# Patient Record
Sex: Female | Born: 1970 | Race: White | Hispanic: No | Marital: Married
Health system: Midwestern US, Community
[De-identification: ages and names within clinical notes are randomized; demographics above are authoritative.]

## PROBLEM LIST (undated history)

## (undated) DIAGNOSIS — E119 Type 2 diabetes mellitus without complications: Secondary | ICD-10-CM

## (undated) DIAGNOSIS — M791 Myalgia, unspecified site: Secondary | ICD-10-CM

## (undated) DIAGNOSIS — E7800 Pure hypercholesterolemia, unspecified: Secondary | ICD-10-CM

## (undated) DIAGNOSIS — I10 Essential (primary) hypertension: Secondary | ICD-10-CM

## (undated) HISTORY — PX: CHOLECYSTECTOMY: SHX55

---

## 2014-04-07 LAB — RUBELLA AB, IGG: Rubella IgG, QL: IMMUNE

## 2014-04-08 LAB — HEP B SURFACE AB
Hep B surface Ab Interp.: NEGATIVE — AB
Hepatitis B surface Ab: 7.22 m[IU]/mL — ABNORMAL LOW (ref >10.0)

## 2014-04-08 LAB — MUMPS AB, IGG: Mumps Abs, IgG: <9 AU/mL — ABNORMAL LOW (ref >10.9)

## 2014-04-08 LAB — VZV AB, IGG: V. zoster, IgG: 1040 index (ref >165)

## 2014-04-08 LAB — RUBEOLA AB, IGG: Rubeola Ab, IgG: 128 AU/mL (ref >29.9)

## 2014-04-08 LAB — HEPATITIS B SURFACE ANTIBODY
Hep B S Ab Interp: NEGATIVE — AB
Hep B S Ab: 7.22 m[IU]/mL — ABNORMAL LOW (ref >10.0)

## 2014-06-16 ENCOUNTER — Ambulatory Visit: Admit: 2014-06-16 | Discharge: 2014-06-16 | Payer: PRIVATE HEALTH INSURANCE | Attending: Internal Medicine

## 2014-06-16 DIAGNOSIS — E119 Type 2 diabetes mellitus without complications: Secondary | ICD-10-CM

## 2014-06-16 MED ORDER — EXENATIDE ER 2 MG SUBCUTANEOUS EXTENDED RELEASE SUSPENSION
2 mg | SUBCUTANEOUS | Status: DC
Start: 2014-06-16 — End: 2015-03-23

## 2014-06-16 MED ORDER — BLOOD GLUCOSE METER KIT
PACK | Status: AC
Start: 2014-06-16 — End: ?

## 2014-06-16 NOTE — Patient Instructions (Addendum)
DASH Diet: After Your Visit  Your Care Instructions  The DASH diet is an eating plan that can help lower your blood pressure. DASH stands for Dietary Approaches to Stop Hypertension. Hypertension is high blood pressure.  The DASH diet focuses on eating foods that are high in calcium, potassium, and magnesium. These nutrients can lower blood pressure. The foods that are highest in these nutrients are fruits, vegetables, low-fat dairy products, nuts, seeds, and legumes. But taking calcium, potassium, and magnesium supplements instead of eating foods that are high in those nutrients does not have the same effect. The DASH diet also includes whole grains, fish, and poultry.  The DASH diet is one of several lifestyle changes your doctor may recommend to lower your high blood pressure. Your doctor may also want you to decrease the amount of sodium in your diet. Lowering sodium while following the DASH diet can lower blood pressure even further than just the DASH diet alone.  Follow-up care is a key part of your treatment and safety. Be sure to make and go to all appointments, and call your doctor if you are having problems. It's also a good idea to know your test results and keep a list of the medicines you take.  How can you care for yourself at home?  Following the DASH diet  ?? Eat 4 to 5 servings of fruit each day. A serving is 1 medium-sized piece of fruit, ?? cup chopped or canned fruit, 1/4 cup dried fruit, or 4 ounces (?? cup) of fruit juice. Choose fruit more often than fruit juice.  ?? Eat 4 to 5 servings of vegetables each day. A serving is 1 cup of lettuce or raw leafy vegetables, ?? cup of chopped or cooked vegetables, or 4 ounces (?? cup) of vegetable juice. Choose vegetables more often than vegetable juice.  ?? Get 2 to 3 servings of low-fat and fat-free dairy each day. A serving is 8 ounces of milk, 1 cup of yogurt, or 1 ?? ounces of cheese.   ?? Eat 6 to 8 servings of grains each day. A serving is 1 slice of bread, 1 ounce of dry cereal, or ?? cup of cooked rice, pasta, or cooked cereal. Try to choose whole-grain products as much as possible.  ?? Limit lean meat, poultry, and fish to 2 servings each day. A serving is 3 ounces, about the size of a deck of cards.  ?? Eat 4 to 5 servings of nuts, seeds, and legumes (cooked dried beans, lentils, and split peas) each week. A serving is 1/3 cup of nuts, 2 tablespoons of seeds, or ?? cup of cooked beans or peas.  ?? Limit fats and oils to 2 to 3 servings each day. A serving is 1 teaspoon of vegetable oil or 2 tablespoons of salad dressing.  ?? Limit sweets and added sugars to 5 servings or less a week. A serving is 1 tablespoon jelly or jam, ?? cup sorbet, or 1 cup of lemonade.  ?? Eat less than 2,300 milligrams (mg) of sodium a day. If you have high blood pressure, diabetes, or chronic kidney disease, if you are African-American, or if you are older than age 50, try to limit the amount of sodium you eat to less than 1,500 mg a day.  Tips for success  ?? Start small. Do not try to make dramatic changes to your diet all at once. You might feel that you are missing out on your favorite foods and then be more   likely to not follow the plan. Make small changes, and stick with them. Once those changes become habit, add a few more changes.  ?? Try some of the following:  ?? Make it a goal to eat a fruit or vegetable at every meal and at snacks. This will make it easy to get the recommended amount of fruits and vegetables each day.  ?? Try yogurt topped with fruit and nuts for a snack or healthy dessert.  ?? Add lettuce, tomato, cucumber, and onion to sandwiches.  ?? Combine a ready-made pizza crust with low-fat mozzarella cheese and lots of vegetable toppings. Try using tomatoes, squash, spinach, broccoli, carrots, cauliflower, and onions.  ?? Have a variety of cut-up vegetables with a low-fat dip as an appetizer  instead of chips and dip.  ?? Sprinkle sunflower seeds or chopped almonds over salads. Or try adding chopped walnuts or almonds to cooked vegetables.  ?? Try some vegetarian meals using beans and peas. Add garbanzo or kidney beans to salads. Make burritos and tacos with mashed pinto beans or black beans.   Where can you learn more?   Go to [email or web address]huhttp://www.healthwise.net/[facility]  Enter H967 in the search box to learn more about "DASH Diet: After Your Visit."   ?? 2006-2015 Healthwise, Incorporated. Care instructions adapted under license by [employer] (which disclaims liability or warranty for this information). This care instruction is for use with your licensed healthcare professional. If you have questions about a medical condition or this instruction, always ask your healthcare professional. Healthwise, Incorporated disclaims any warranty or liability for your use of this information.  Content Version: 10.5.422740; Current as of: October 17, 2013            Also go to the [employer] website and search this diet.

## 2014-06-16 NOTE — Progress Notes (Signed)
1. Have you been to the ER, urgent care clinic since your last visit?  Hospitalized since your last visit?Yes When: August, [facility], yeast infection    2. Have you seen or consulted any other health care providers outside of the [facility] since your last visit?  Include any pap smears or colon screening. No

## 2014-06-16 NOTE — Progress Notes (Signed)
Eileen Juarez is a 43 y.o.  female and presents with Establish Care       Subjective:    DM type 2- for about 11 years- not the best  Control in the past but now was 7%. Used to be around 11%. Just moved here recently from [place]. Gets yearly eye exam. Some restless legs at times. No known neuropathy. No nephropathy. No retinopathy- sees lens crafter- last was 11/2013. Has been on Invokana for year now. Taking 2k mg metformin once daily because BID dosing caused diarrhea.   Obesity- goal of BMI less than 25 discussed today. Has had success with 21 day fix.   Health maintenance- sees gyn - had mammogram and pap. Had flu shot.   Had abnormal thyroid test at one point in distant past but self corrected.     ROS:  Constitutional: No recent weight change. No weakness/fatigue.  No f/c.   Skin: No rashes, change in nails/hair, itching   HENT: No HA, dizziness. No hearing loss/tinnitus.  No nasal congestion/discharge.   Eyes: No change in vision, double/blurred vision or eye pain/redness.    Cardiovascular: No CP/palpitations.  No DOE/orthopnea/PND.   Respiratory: No cough/sputum, dyspnea, wheezing.   Gastointestinal: No dysphagia, reflux.  No n/v.  No constipation/diarrhea.  No melena/rectal bleeding.   Genitourinary: No dysuria, urinary hesitancy, nocturia, hematuria.  No incontinence.   Musculoskeletal: No joint pain/stiffness.  No muscle pain/tenderness.   Endo: No heat/cold intolerance, no polyuria/polydypsia.   Heme: No h/o anemia.  No easy bleeding/bruising.   Allergy/Immunology: No seasonal rhinitis. Denies frequent colds, sinus/ear infections.   Neurological: No seizures/numbness/weakness.  No paresthesias.   Psychiatric:  No depression, anxiety.     PMH:  Past Medical History   Diagnosis Date   ??? Diabetes ([place])      type 2       There is no problem list on file for this patient.      PSH:  Past Surgical History   Procedure Laterality Date   ??? Hx cholecystectomy     ??? Hx gyn          SH:  History    Substance Use Topics   ??? Smoking status: Never Smoker    ??? Smokeless tobacco: Never Used   ??? Alcohol Use: No   works as Marine scientist in [employer]  Married-  Children - twins 11- healthy.     FH:  Family History   Problem Relation Age of Onset   ??? Hypertension Mother    ??? Osteoporosis Mother    ??? Diabetes Father    ??? Hypertension Father    ??? Kidney Disease Father    ??? Elevated Lipids Father    ??? Heart Disease Sister    ??? No Known Problems Brother    ??? Cancer- breast Maternal Grandmother        Medications/Allergies:  Current outpatient prescriptions: metFORMIN (GLUCOPHAGE) 1,000 mg tablet, Take 2,000 mg by mouth once., Disp: , Rfl: 4;  INVOKANA 100 mg tablet, Take 100 mg by mouth once., Disp: , Rfl: 5;  Cetirizine (ZYRTEC) 10 mg cap, Take  by mouth., Disp: , Rfl: ;  cholecalciferol, vitamin D3, (VITAMIN D3) 2,000 unit tab, Take  by mouth., Disp: , Rfl: ;  cranberry extract 450 mg tab, Take  by mouth., Disp: , Rfl:   fluconazole (DIFLUCAN) 100 mg tablet, Take 200 mg by mouth daily., Disp: , Rfl:     Allergies   Allergen Reactions   ???  Iodine Anaphylaxis   ??? Shellfish Derived Anaphylaxis       Objective:  BP 129/89 mmHg   Pulse 73   Temp(Src) 96.6 ??F (35.9 ??C) (Oral)   Resp 16   Ht '5\' 2"'  (1.575 m)   Wt 198 lb (89.812 kg)   BMI 36.21 kg/m2   LMP 06/02/2014 (Approximate) Body mass index is 36.21 kg/(m^2).  Constitutional: Well developed, nourished, no distress, alert   HENT: Exterior ears and tympanic membranes normal bilaterally. Supple neck. No thyromegaly or lymphadenopathy. Oropharynx clear and moist mucous membranes.     Eyes: Conjunctiva normal. PERRL.    Cardiovascular: S1, S2.  RRR.  No murmurs/rubs.  No thrills palpated.  No carotid bruits.  Intact distal pulses.  No edema.   Pulmonary/Chest Wall: No abnormalities on inspection.  Clear to auscultation bilaterally. No wheezing/rhonchi.  Normal effort.     GI: Soft, nontender, nondistended.  Normal active bowel sounds. No  masses on palpation.  No hepatosplenomegaly.    Musculoskeletal: Gait normal.  Joints without deformity/tenderness.  Strength intact bilateral upper and lower ext.  Normal ROM all extremities.     Neurological: Appropriate.  2+DTR.  No focal motor or sensory deficits. Speech normal.   Skin: No lesions/rashes on inspection.     Psych: Appropriate affect, judgement and insight.  Short-term memory intact.         Assessment/Plan:      1. DM type 2- controlled but borderline- would like stop invokana at this point given recent data about bone density concerns. Pt will monitor bs closely for next 2 weeks and call if remaining over 180-200 range. Will try to add bydureon but cost was a problem in past with victoza- discussed risks/benefits/possible side effects- (pancreatitis and thyroid cancer in rats reviewed). May need to add [facility]. Didn't tolerate [place] due to n/v. Also recommended only taking metformin 1051m daily for now. Could try to switch to long acting version but cost may be an issue. At next visit will try to encourage starting very low dose at night.   2. Obesity- Discussed the patient's above normal BMI with her.  I have recommended the following interventions: dietary management education, guidance, and counseling .  The BMI follow up plan is as follows: BMI is out of normal parameters and plan is as follows: Referred to Dietitian and I have counseled this patient on diet and exercise regimens- DASH diet given as well.       Orders Placed This Encounter   ??? METABOLIC PANEL, COMPREHENSIVE     Standing Status: Future      Number of Occurrences:       Standing Expiration Date: 08/19/2014   ??? HEMOGLOBIN A1C     Standing Status: Future      Number of Occurrences:       Standing Expiration Date: 08/19/2014   ??? CBC W/O DIFF     Standing Status: Future      Number of Occurrences:       Standing Expiration Date: 08/19/2014   ??? THYROID CASCADE PROFILE     Standing Status: Future      Number of Occurrences:       Standing Expiration Date: 08/19/2014    ??? LIPID PANEL     Standing Status: Future      Number of Occurrences:       Standing Expiration Date: 08/19/2014   ??? MICROALBUMIN:CREATININE RATIO, RANDOM URINE     Standing Status: Future  Number of Occurrences:       Standing Expiration Date: 08/19/2014   ??? REFERRAL TO DIETICIAN     Referral Priority:  Routine     Referral Type:  Consultation     Referral Reason:  Specialty Services Required     Requested Specialty:  Dietician   ??? metFORMIN (GLUCOPHAGE) 1,000 mg tablet     Sig: Take 2,000 mg by mouth once.     Refill:  4   ??? DISCONTD: INVOKANA 100 mg tablet     Sig: Take 100 mg by mouth once.     Refill:  5   ??? Cetirizine (ZYRTEC) 10 mg cap     Sig: Take  by mouth.   ??? cholecalciferol, vitamin D3, (VITAMIN D3) 2,000 unit tab     Sig: Take  by mouth.   ??? cranberry extract 450 mg tab     Sig: Take  by mouth.   ??? fluconazole (DIFLUCAN) 100 mg tablet     Sig: Take 200 mg by mouth daily.   ??? Blood-Glucose Meter monitoring kit     Sig: Use to check blood sugar daily. Please dispense brand compatible with insurance.     Dispense:  1 Kit     Refill:  0   ??? exenatide microspheres (BYDUREON) 2 mg serr     Sig: 2 mg by SubCUTAneous route every seven (7) days.     Dispense:  4 Each     Refill:  11

## 2014-06-17 ENCOUNTER — Encounter

## 2014-06-17 MED ORDER — LANCETS
Status: AC
Start: 2014-06-17 — End: ?

## 2014-06-17 MED ORDER — BLOOD SUGAR DIAGNOSTIC TEST STRIPS
ORAL_STRIP | Status: AC
Start: 2014-06-17 — End: ?

## 2014-06-18 LAB — METABOLIC PANEL, COMPREHENSIVE
A-G Ratio: 2.2 (ref 1.1–2.5)
ALT (SGPT): 17 IU/L (ref 0–32)
AST (SGOT): 15 IU/L (ref 0–40)
Albumin: 4.9 g/dL (ref 3.5–5.5)
Alk. phosphatase: 76 IU/L (ref 39–117)
BUN/Creatinine ratio: 15 (ref 9–23)
BUN: 10 mg/dL (ref 6–24)
Bilirubin, total: 0.5 mg/dL (ref 0.0–1.2)
CO2: 23 mmol/L (ref 18–29)
Calcium: 9.7 mg/dL (ref 8.7–10.2)
Chloride: 98 mmol/L (ref 97–108)
Creatinine: 0.65 mg/dL (ref 0.57–1.00)
GFR est AA: 128 mL/min/{1.73_m2} (ref >59)
GFR est non-AA: 111 mL/min/{1.73_m2} (ref >59)
GLOBULIN, TOTAL: 2.2 g/dL (ref 1.5–4.5)
Glucose: 136 mg/dL — ABNORMAL HIGH (ref 65–99)
Potassium: 4.8 mmol/L (ref 3.5–5.2)
Protein, total: 7.1 g/dL (ref 6.0–8.5)
Sodium: 140 mmol/L (ref 134–144)

## 2014-06-18 LAB — CBC W/O DIFF
HCT: 44.3 % (ref 34.0–46.6)
HGB: 14.5 g/dL (ref 11.1–15.9)
MCH: 25.3 pg — ABNORMAL LOW (ref 26.6–33.0)
MCHC: 32.7 g/dL (ref 31.5–35.7)
MCV: 77 fL — ABNORMAL LOW (ref 79–97)
PLATELET: 296 10*3/uL (ref 150–379)
RBC: 5.73 x10E6/uL — ABNORMAL HIGH (ref 3.77–5.28)
RDW: 15 % (ref 12.3–15.4)
WBC: 9.9 10*3/uL (ref 3.4–10.8)

## 2014-06-18 LAB — LIPID PANEL
Cholesterol, total: 203 mg/dL — ABNORMAL HIGH (ref 100–199)
HDL Cholesterol: 45 mg/dL (ref >39)
LDL, calculated: 138 mg/dL — ABNORMAL HIGH (ref 0–99)
Triglyceride: 99 mg/dL (ref 0–149)
VLDL, calculated: 20 mg/dL (ref 5–40)

## 2014-06-18 LAB — THYROID CASCADE PROFILE: TSH: 1.15 u[IU]/mL (ref 0.450–4.500)

## 2014-06-18 LAB — CVD REPORT

## 2014-06-18 LAB — HEMOGLOBIN A1C WITH EAG: Hemoglobin A1c: 7.1 % — ABNORMAL HIGH (ref 4.8–5.6)

## 2014-06-18 LAB — MICROALBUMIN:CREATININE RATIO, RANDOM URINE
Creatinine, urine random: 106 mg/dL (ref 15.0–278.0)
Microalb/Creat ratio (ug/mg creat.): 11.8 mg/g creat (ref 0.0–30.0)
Microalbumin, urine: 12.5 ug/mL (ref 0.0–17.0)

## 2014-06-29 ENCOUNTER — Encounter: Attending: Internal Medicine

## 2014-06-29 ENCOUNTER — Ambulatory Visit: Admit: 2014-06-29 | Attending: Internal Medicine

## 2014-06-29 DIAGNOSIS — E785 Hyperlipidemia, unspecified: Secondary | ICD-10-CM

## 2014-06-29 MED ORDER — ASPIRIN 81 MG CHEWABLE TAB
81 mg | ORAL_TABLET | Freq: Every day | ORAL | Status: AC
Start: 2014-06-29 — End: ?

## 2014-06-29 MED ORDER — ATORVASTATIN 40 MG TAB
40 mg | ORAL_TABLET | Freq: Every day | ORAL | Status: DC
Start: 2014-06-29 — End: 2015-09-04

## 2014-06-29 NOTE — Patient Instructions (Signed)
Diabetes Foot Care: After Your Visit  Your Care Instructions     When you have diabetes, your feet need extra care and attention. Diabetes can damage the nerve endings and blood vessels in your feet, making you less likely to notice when your feet are injured. Diabetes also limits your body's ability to fight infection and get blood to areas that need it. If you get a minor foot injury, it could become an ulcer or a serious infection. With good foot care, you can prevent most of these problems.  Caring for your feet can be quick and easy. Most of the care can be done when you are bathing or getting ready for bed.  Follow-up care is a key part of your treatment and safety. Be sure to make and go to all appointments, and call your doctor if you are having problems. It???s also a good idea to know your test results and keep a list of the medicines you take.  How can you care for yourself at home?  ?? Keep your blood sugar close to normal by watching what and how much you eat, monitoring blood sugar, taking medicines if prescribed, and getting regular exercise.  ?? Do not smoke. Smoking affects blood flow and can make foot problems worse. If you need help quitting, talk to your doctor about stop-smoking programs and medicines. These can increase your chances of quitting for good.  ?? Eat a diet that is low in fats. High fat intake can cause fat to build up in your blood vessels and decrease blood flow.  ?? Inspect your feet daily for blisters, cuts, cracks, or sores. If you cannot see well, use a mirror or have someone help you.  ?? Take care of your feet:  ?? Wash your feet every day. Use warm (not hot) water. Check the water temperature with your wrists or other part of your body, not your feet.  ?? Dry your feet well. Pat them dry. Do not rub the skin on your feet too hard. Dry well between your toes. If the skin on your feet stays moist, bacteria or a fungus can grow, which can lead to infection.   ?? Keep your skin soft. Use moisturizing skin cream to keep the skin on your feet soft and prevent calluses and cracks. But do not put the cream between your toes, and stop using any cream that causes a rash.  ?? Clean underneath your toenails carefully. Do not use a sharp object to clean underneath your toenails. Use the blunt end of a nail file or other rounded tool.  ?? Trim and file your toenails straight across to prevent ingrown toenails. Use a nail clipper, not scissors. Use an emery board to smooth the edges.  ?? Change socks daily. Socks without seams are best, because seams often rub the feet. You can find socks for people with diabetes from specialty catalogs.  ?? Look inside your shoes every day for things like gravel or torn linings, which could cause blisters or sores.  ?? Buy shoes that fit well:  ?? Look for shoes that have plenty of space around the toes. This helps prevent bunions and blisters.  ?? Try on shoes while wearing the kind of socks you will usually wear with the shoes.  ?? Avoid plastic shoes. They may rub your feet and cause blisters. Good shoes should be made of materials that are flexible and breathable, such as leather or cloth.  ?? Break in new shoes slowly   by wearing them for no more than an hour a day for several days. Take extra time to check your feet for red areas, blisters, or other problems after you wear new shoes.  ?? Do not go barefoot. Do not wear sandals, and do not wear shoes with very thin soles. Thin soles are easy to puncture. They also do not protect your feet from hot pavement or cold weather.  ?? Have your doctor check your feet during each visit. If you have a foot problem, see your doctor. Do not try to treat an early foot problem at home. Home remedies or treatments that you can buy without a prescription (such as corn removers) can be harmful.  ?? Always get early treatment for foot problems. A minor irritation can lead to a major problem if not properly cared for early.   When should you call for help?  Call your doctor now or seek immediate medical care if:  ?? You have a foot sore, an ulcer or break in the skin that is not healing after 4 days, bleeding corns or calluses, or an ingrown toenail.  ?? You have blue or black areas, which can mean bruising or blood flow problems.  ?? You have peeling skin or tiny blisters between your toes or cracking or oozing of the skin.  ?? You have a fever for more than 24 hours and a foot sore.  ?? You have new numbness or tingling in your feet that does not go away after you move your feet or change positions.  ?? You have unexplained or unusual swelling of the foot or ankle.  Watch closely for changes in your health, and be sure to contact your doctor if:  ?? You cannot do proper foot care.   Where can you learn more?   Go to http://www.healthwise.net/[facility]  Enter A739 in the search box to learn more about "Diabetes Foot Care: After Your Visit."   ?? 2006-2015 Healthwise, Incorporated. Care instructions adapted under license by [facility] (which disclaims liability or warranty for this information). This care instruction is for use with your licensed healthcare professional. If you have questions about a medical condition or this instruction, always ask your healthcare professional. Healthwise, Incorporated disclaims any warranty or liability for your use of this information.  Content Version: 10.5.422740; Current as of: July 11, 2013

## 2014-06-29 NOTE — Progress Notes (Signed)
Eileen Juarez is a 43 y.o. female and presents with Diabetes and Cholesterol Problem       Subjective:    DM type 2- uncontrolled- using bydureon. Stopped invokana. bs lowest 113. No hypoglycemia but felt shaky at 113. Highest bs 145.   Obesity- weight up but near period.   Health maintenance- sees gyn - had mammogram and pap. Had flu shot.   Father had colonoscopy and found adenomatous polyp removed.     Assessment/Plan:      Dm type 2 uncontrolled - but appears to be improving on bydureon- expect continued improvement on this for another 2 weeks or so.   Obesity-  Continue to focus on 1 lb/week average wt loss.   Health maintenance- pt will ask lens crafters to send eye exam to [place] the mall.   Father with polyp- at age 59 this is not likely a risk factor for her- colo at age 47 recommended. Discussed red meat/dietary influences.     RTC in 3 months - poc a1c then.   Orders Placed This Encounter   ??? atorvastatin (LIPITOR) 40 mg tablet     Sig: Take 1 Tab by mouth daily.     Dispense:  90 Tab     Refill:  3   ??? aspirin 81 mg chewable tablet     Sig: Take 1 Tab by mouth daily.     Dispense:  1 Tab     Refill:  0       ROS:  Negative except as mentioned above  Cardiac- no chest pain or palpitations  Pulmonary- no sob or wheezes  GI- no n/v or diarrhea.      SH:  History   Substance Use Topics   ??? Smoking status: Never Smoker    ??? Smokeless tobacco: Never Used   ??? Alcohol Use: No         Medications/Allergies:  Current Outpatient Prescriptions on File Prior to Visit   Medication Sig Dispense Refill   ??? glucose blood VI test strips (FREESTYLE LITE STRIPS) strip Use to check blood sugar daily 100 Strip 11   ??? Lancets misc Use to check blood sugar daily. Dispense for Freestyle Lite system. Dispense 100 lancets 100 Each 11   ??? metFORMIN (GLUCOPHAGE) 1,000 mg tablet Take 2,000 mg by mouth once.  4   ??? Cetirizine (ZYRTEC) 10 mg cap Take  by mouth.      ??? cholecalciferol, vitamin D3, (VITAMIN D3) 2,000 unit tab Take  by mouth.     ??? cranberry extract 450 mg tab Take  by mouth.     ??? fluconazole (DIFLUCAN) 100 mg tablet Take 200 mg by mouth daily.     ??? Blood-Glucose Meter monitoring kit Use to check blood sugar daily. Please dispense brand compatible with insurance. 1 Kit 0   ??? exenatide microspheres (BYDUREON) 2 mg serr 2 mg by SubCUTAneous route every seven (7) days. 4 Each 11     No current facility-administered medications on file prior to visit.          Allergies   Allergen Reactions   ??? Iodine Anaphylaxis   ??? Shellfish Derived Anaphylaxis   ??? Januvia [Sitagliptin] Nausea and Vomiting       Objective:  BP 126/66 mmHg   Pulse 70   Temp(Src) 97.6 ??F (36.4 ??C) (Oral)   Resp 16   Ht _0  (1.575 m)   Wt 204 lb 9.6 oz (92.806 kg)  BMI 37.41 kg/m2   SpO2 95%   LMP 06/02/2014 (Approximate) Body mass index is 37.41 kg/(m^2).  Constitutional: Well developed, nourished, no distress, alert     Diabetic foot exam:     Left and right    Filament test normal sensation with micro filament   Pulse DP: 2+ (normal)   Pulse PT: 2+ (normal)   Deformities: None

## 2014-06-29 NOTE — Progress Notes (Signed)
Gwenlyn PerkingCandi Contee is here today to follow up for DM and high cholesterol    1. Have you been to the ER, urgent care clinic since your last visit?  Hospitalized since your last visit?No    2. Have you seen or consulted any other health care providers outside of the [facility] since your last visit?  Include any pap smears or colon screening. No

## 2014-08-13 ENCOUNTER — Ambulatory Visit: Admit: 2014-08-13 | Discharge: 2014-08-13 | Payer: PRIVATE HEALTH INSURANCE | Attending: Internal Medicine

## 2014-08-13 DIAGNOSIS — J0110 Acute frontal sinusitis, unspecified: Secondary | ICD-10-CM

## 2014-08-13 MED ORDER — AZITHROMYCIN 250 MG TAB
250 mg | ORAL_TABLET | ORAL | Status: DC
Start: 2014-08-13 — End: 2014-10-27

## 2014-08-13 MED ORDER — ALBUTEROL SULFATE HFA 90 MCG/ACTUATION AEROSOL INHALER
90 mcg/actuation | Freq: Four times a day (QID) | RESPIRATORY_TRACT | Status: DC | PRN
Start: 2014-08-13 — End: 2015-10-27

## 2014-08-13 NOTE — Progress Notes (Signed)
1. Have you been to the ER, urgent care clinic since your last visit?  Hospitalized since your last visit?No    2. Have you seen or consulted any other health care providers outside of the [facility] since your last visit?  Include any pap smears or colon screening. No

## 2014-08-13 NOTE — Progress Notes (Signed)
Eileen Juarez is a 43 y.o. female and presents with Other       Subjective:    Cough for almost a month - worse in the morning. No f/c but feels congested. Some sl wheezes noted in am but usually clear quickly.     Assessment/Plan:    Frontal sinusitis- rx for ABX and discussed decongestants. Also given albuterol with h/o wheezes in case there is a component of reactive airway dz.     RTC  Prn.   Orders Placed This Encounter   ??? azithromycin (ZITHROMAX) 250 mg tablet     Sig: Take two tablets today then one tablet daily     Dispense:  6 Tab     Refill:  0   ??? albuterol (PROVENTIL HFA, VENTOLIN HFA, PROAIR HFA) 90 mcg/actuation inhaler     Sig: Take 1 Puff by inhalation every six (6) hours as needed for Wheezing.     Dispense:  1 Inhaler     Refill:  1         ROS:  Negative except as mentioned above  Cardiac- no chest pain or palpitations  GI- no n/v or diarrhea.      SH:  History   Substance Use Topics   ??? Smoking status: Never Smoker    ??? Smokeless tobacco: Never Used   ??? Alcohol Use: No         Medications/Allergies:  Current Outpatient Prescriptions on File Prior to Visit   Medication Sig Dispense Refill   ??? atorvastatin (LIPITOR) 40 mg tablet Take 1 Tab by mouth daily. 90 Tab 3   ??? aspirin 81 mg chewable tablet Take 1 Tab by mouth daily. 1 Tab 0   ??? glucose blood VI test strips (FREESTYLE LITE STRIPS) strip Use to check blood sugar daily 100 Strip 11   ??? Lancets misc Use to check blood sugar daily. Dispense for Freestyle Lite system. Dispense 100 lancets 100 Each 11   ??? metFORMIN (GLUCOPHAGE) 1,000 mg tablet Take 2,000 mg by mouth once.  4   ??? Cetirizine (ZYRTEC) 10 mg cap Take  by mouth.     ??? cholecalciferol, vitamin D3, (VITAMIN D3) 2,000 unit tab Take  by mouth.     ??? cranberry extract 450 mg tab Take  by mouth.     ??? Blood-Glucose Meter monitoring kit Use to check blood sugar daily. Please dispense brand compatible with insurance. 1 Kit 0    ??? exenatide microspheres (BYDUREON) 2 mg serr 2 mg by SubCUTAneous route every seven (7) days. 4 Each 11   ??? fluconazole (DIFLUCAN) 100 mg tablet Take 200 mg by mouth daily.       No current facility-administered medications on file prior to visit.          Allergies   Allergen Reactions   ??? Iodine Anaphylaxis   ??? Shellfish Derived Anaphylaxis   ??? Januvia [Sitagliptin] Nausea and Vomiting       Objective:  BP 136/74 mmHg   Pulse 73   Temp(Src) 96.9 ??F (36.1 ??C) (Oral)   Resp 18   Ht '5\' 2"'  (1.575 m)   Wt 210 lb (95.255 kg)   BMI 38.40 kg/m2   LMP 08/12/2014 Body mass index is 38.4 kg/(m^2).  Constitutional: Well developed, nourished, no distress, alert   HENT: Exterior ears and tympanic membranes normal bilaterally. Supple neck. No thyromegaly or lymphadenopathy. Oropharynx clear and moist mucous membranes.  Left mucus membranes swollen and sl erythematous. Mild frontal sinus  ttp.    Eyes: Conjunctiva normal.    CV: S1, S2.  RRR.  No murmurs/rubs. No edema.   Pulm: No abnormalities on inspection.  Clear to auscultation bilaterally. No wheezing/rhonchi.  Normal effort.

## 2014-08-13 NOTE — Patient Instructions (Addendum)
Start a decongestant of your choice- either pseudafed behind the counter or Afrin (up to 5 days)  Sinusitis: After Your Visit  Your Care Instructions     Sinusitis is an infection of the lining of the sinus cavities in your head. Sinusitis often follows a cold. It causes pain and pressure in your head and face.  In most cases, sinusitis gets better on its own in 1 to 2 weeks. But some mild symptoms may last for several weeks. Sometimes antibiotics are needed.  Follow-up care is a key part of your treatment and safety. Be sure to make and go to all appointments, and call your doctor if you are having problems. It's also a good idea to know your test results and keep a list of the medicines you take.  How can you care for yourself at home?  ?? Take an over-the-counter pain medicine, such as acetaminophen (Tylenol), ibuprofen (Advil, Motrin), or naproxen (Aleve). Read and follow all instructions on the label.  ?? If the doctor prescribed antibiotics, take them as directed. Do not stop taking them just because you feel better. You need to take the full course of antibiotics.  ?? Be careful when taking over-the-counter cold or flu medicines and Tylenol at the same time. Many of these medicines have acetaminophen, which is Tylenol. Read the labels to make sure that you are not taking more than the recommended dose. Too much acetaminophen (Tylenol) can be harmful.  ?? Breathe warm, moist air from a steamy shower, a hot bath, or a sink filled with hot water. Avoid cold, dry air. Using a humidifier in your home may help. Follow the directions for cleaning the machine.  ?? Use saline (saltwater) nasal washes to help keep your nasal passages open and wash out mucus and bacteria. You can buy saline nose drops at a grocery store or drugstore. Or you can make your own at home by adding 1 teaspoon of salt and 1 teaspoon of baking soda to 2 cups of distilled water. If you make your own, fill a bulb syringe with the solution, insert  the tip into your nostril, and squeeze gently. Blow your nose.  ?? Put a hot, wet towel or a warm gel pack on your face 3 or 4 times a day for 5 to 10 minutes each time.  ?? Try a decongestant nasal spray like oxymetazoline (Afrin). Do not use it for more than 3 days in a row. Using it for more than 3 days can make your congestion worse.  When should you call for help?  Call your doctor now or seek immediate medical care if:  ?? You have new or worse swelling or redness in your face or around your eyes.  ?? You have a new or higher fever.  Watch closely for changes in your health, and be sure to contact your doctor if:  ?? You have new or worse facial pain.  ?? The mucus from your nose becomes thicker (like pus) or has new blood in it.  ?? You are not getting better as expected.   Where can you learn more?   Go to [email or web address]://www.healthwise.net/[email or web address]  Enter [phone] in the search box to learn more about "Sinusitis: After Your Visit."   ?? 2006-2015 Healthwise, Incorporated. Care instructions adapted under license by [employer] (which disclaims liability or warranty for this information). This care instruction is for use with your licensed healthcare professional. If you have questions about a medical condition or this instruction, always  ask your healthcare professional. Healthwise, Incorporated disclaims any warranty or liability for your use of this information.  Content Version: 10.5.422740; Current as of: July 11, 2013

## 2014-10-13 ENCOUNTER — Ambulatory Visit: Admit: 2014-10-13 | Discharge: 2014-10-13 | Payer: PRIVATE HEALTH INSURANCE | Attending: Internal Medicine

## 2014-10-13 DIAGNOSIS — J0121 Acute recurrent ethmoidal sinusitis: Secondary | ICD-10-CM

## 2014-10-13 MED ORDER — AMOXICILLIN CLAVULANATE 875 MG-125 MG TAB
875-125 mg | ORAL_TABLET | Freq: Two times a day (BID) | ORAL | Status: AC
Start: 2014-10-13 — End: 2014-10-23

## 2014-10-13 NOTE — Patient Instructions (Signed)
Sinusitis: Care Instructions  Your Care Instructions     Sinusitis is an infection of the lining of the sinus cavities in your head. Sinusitis often follows a cold. It causes pain and pressure in your head and face.  In most cases, sinusitis gets better on its own in 1 to 2 weeks. But some mild symptoms may last for several weeks. Sometimes antibiotics are needed.  Follow-up care is a key part of your treatment and safety. Be sure to make and go to all appointments, and call your doctor if you are having problems. It's also a good idea to know your test results and keep a list of the medicines you take.  How can you care for yourself at home?  ?? Take an over-the-counter pain medicine, such as acetaminophen (Tylenol), ibuprofen (Advil, Motrin), or naproxen (Aleve). Read and follow all instructions on the label.  ?? If the doctor prescribed antibiotics, take them as directed. Do not stop taking them just because you feel better. You need to take the full course of antibiotics.  ?? Be careful when taking over-the-counter cold or flu medicines and Tylenol at the same time. Many of these medicines have acetaminophen, which is Tylenol. Read the labels to make sure that you are not taking more than the recommended dose. Too much acetaminophen (Tylenol) can be harmful.  ?? Breathe warm, moist air from a steamy shower, a hot bath, or a sink filled with hot water. Avoid cold, dry air. Using a humidifier in your home may help. Follow the directions for cleaning the machine.  ?? Use saline (saltwater) nasal washes to help keep your nasal passages open and wash out mucus and bacteria. You can buy saline nose drops at a grocery store or drugstore. Or you can make your own at home by adding 1 teaspoon of salt and 1 teaspoon of baking soda to 2 cups of distilled water. If you make your own, fill a bulb syringe with the solution, insert the tip into your nostril, and squeeze gently. Blow your nose.   ?? Put a hot, wet towel or a warm gel pack on your face 3 or 4 times a day for 5 to 10 minutes each time.  ?? Try a decongestant nasal spray like oxymetazoline (Afrin). Do not use it for more than 3 days in a row. Using it for more than 3 days can make your congestion worse.  When should you call for help?  Call your doctor now or seek immediate medical care if:  ?? You have new or worse swelling or redness in your face or around your eyes.  ?? You have a new or higher fever.  Watch closely for changes in your health, and be sure to contact your doctor if:  ?? You have new or worse facial pain.  ?? The mucus from your nose becomes thicker (like pus) or has new blood in it.  ?? You are not getting better as expected.   Where can you learn more?   Go to http://www.healthwise.net/[facility]  Enter I933 in the search box to learn more about "Sinusitis: Care Instructions."   ?? 2006-2015 Healthwise, Incorporated. Care instructions adapted under license by [employer] (which disclaims liability or warranty for this information). This care instruction is for use with your licensed healthcare professional. If you have questions about a medical condition or this instruction, always ask your healthcare professional. Healthwise, Incorporated disclaims any warranty or liability for your use of this information.  Content Version:   10.7.482551; Current as of: July 11, 2013

## 2014-10-13 NOTE — Progress Notes (Signed)
Eileen Juarez is a 44 y.o. female and presents with Nausea       Subjective:    Was having nausea but then got h/a with pulsating behind right eye. Has pressure in facial area behind nose. No cough but has green nasal d/c.   No f/c. No sob.   DM type 2- bs running 180's.   Gets about 3 sinus infections per year chronically. Has never seen an ENT in the past.     Assessment/Plan:    Sinusitis-Augmentin and afrin for 5 days. Will refer to ENT for recurrent sinus infections- given referral and she will bring this to ENT office in this building on her way out. .    RTC prn  Orders Placed This Encounter   ??? REFERRAL TO ENT-OTOLARYNGOLOGY     Referral Priority:  Routine     Referral Type:  Consultation     Referral Reason:  Specialty Services Required   ??? amoxicillin-clavulanate (AUGMENTIN) 875-125 mg per tablet     Sig: Take 1 Tab by mouth every twelve (12) hours for 10 days.     Dispense:  20 Tab     Refill:  0       ROS:  Negative except as mentioned above  Cardiac-  Pulmonary-  GI-    SH:  History   Substance Use Topics   ??? Smoking status: Never Smoker    ??? Smokeless tobacco: Never Used   ??? Alcohol Use: No         Medications/Allergies:  Current Outpatient Prescriptions on File Prior to Visit   Medication Sig Dispense Refill   ??? azithromycin (ZITHROMAX) 250 mg tablet Take two tablets today then one tablet daily 6 Tab 0   ??? albuterol (PROVENTIL HFA, VENTOLIN HFA, PROAIR HFA) 90 mcg/actuation inhaler Take 1 Puff by inhalation every six (6) hours as needed for Wheezing. 1 Inhaler 1   ??? atorvastatin (LIPITOR) 40 mg tablet Take 1 Tab by mouth daily. 90 Tab 3   ??? aspirin 81 mg chewable tablet Take 1 Tab by mouth daily. 1 Tab 0   ??? glucose blood VI test strips (FREESTYLE LITE STRIPS) strip Use to check blood sugar daily 100 Strip 11   ??? Lancets misc Use to check blood sugar daily. Dispense for Freestyle Lite system. Dispense 100 lancets 100 Each 11   ??? metFORMIN (GLUCOPHAGE) 1,000 mg tablet Take 2,000 mg by mouth once.  4    ??? Cetirizine (ZYRTEC) 10 mg cap Take  by mouth.     ??? cholecalciferol, vitamin D3, (VITAMIN D3) 2,000 unit tab Take  by mouth.     ??? cranberry extract 450 mg tab Take  by mouth.     ??? fluconazole (DIFLUCAN) 100 mg tablet Take 200 mg by mouth daily.     ??? Blood-Glucose Meter monitoring kit Use to check blood sugar daily. Please dispense brand compatible with insurance. 1 Kit 0   ??? exenatide microspheres (BYDUREON) 2 mg serr 2 mg by SubCUTAneous route every seven (7) days. 4 Each 11     No current facility-administered medications on file prior to visit.          Allergies   Allergen Reactions   ??? Iodine Anaphylaxis   ??? Shellfish Derived Anaphylaxis   ??? Januvia [Sitagliptin] Nausea and Vomiting       Objective:  BP 133/86 mmHg   Pulse 86   Temp(Src) 96.9 ??F (36.1 ??C) (Oral)   Resp 18   Ht 5'  2" (1.575 m)   Wt 209 lb (94.802 kg)   BMI 38.22 kg/m2   LMP 09/02/2014 Body mass index is 38.22 kg/(m^2).  Constitutional: Well developed, nourished, no distress, alert   HENT: Exterior ears and tympanic membranes normal bilaterally. Supple neck. No thyromegaly or lymphadenopathy. Oropharynx clear and moist mucous membranes.  Nasal mucosa sl erythematous.    Eyes: Conjunctiva normal. PERRL. No papilledema   CV: S1, S2.  RRR.     Pulm: No abnormalities on inspection.  Clear to auscultation bilaterally. No wheezing/rhonchi.  Normal effort.     GI: Soft, nontender, nondistended.  Normal active bowel sounds.

## 2014-10-13 NOTE — Progress Notes (Signed)
Please make an appt for pt today.

## 2014-10-13 NOTE — Progress Notes (Signed)
Spoke to patient.

## 2014-10-13 NOTE — Progress Notes (Signed)
1. Have you been to the ER, urgent care clinic since your last visit?  Hospitalized since your last visit?No    2. Have you seen or consulted any other health care providers outside of the [facility] since your last visit?  Include any pap smears or colon screening. No

## 2014-10-27 MED ORDER — LEVOFLOXACIN 500 MG TAB
500 mg | ORAL_TABLET | Freq: Every day | ORAL | Status: AC
Start: 2014-10-27 — End: 2014-11-06

## 2015-03-23 ENCOUNTER — Ambulatory Visit: Admit: 2015-03-23 | Discharge: 2015-03-23 | Payer: PRIVATE HEALTH INSURANCE | Attending: Internal Medicine

## 2015-03-23 DIAGNOSIS — E119 Type 2 diabetes mellitus without complications: Secondary | ICD-10-CM

## 2015-03-23 MED ORDER — METFORMIN 1,000 MG TAB
1000 mg | ORAL_TABLET | Freq: Two times a day (BID) | ORAL | Status: DC
Start: 2015-03-23 — End: 2016-03-14

## 2015-03-23 MED ORDER — GLIMEPIRIDE 2 MG TAB
2 mg | ORAL_TABLET | ORAL | Status: DC
Start: 2015-03-23 — End: 2016-03-14

## 2015-03-23 MED ORDER — CITALOPRAM 10 MG TAB
10 mg | ORAL_TABLET | Freq: Every day | ORAL | Status: DC
Start: 2015-03-23 — End: 2016-03-14

## 2015-03-23 NOTE — Progress Notes (Signed)
Patient here to follow up. [facility]    1. Have you been to the ER, urgent care clinic since your last visit?  Hospitalized since your last visit?No    2. Have you seen or consulted any other health care providers outside of the [facility] since your last visit?  Include any pap smears or colon screening. No

## 2015-03-23 NOTE — Progress Notes (Signed)
Eileen Juarez is a 44 y.o. female and presents with Anxiety; Cholesterol Problem; and Diabetes       Subjective:    DM - uncontrolled due to stress. A1C was over 12%. Not checking blood sugar otherwise. Unable to continue bydureon due to cost for about the last month.   Anxiety and depression- father with metastatic Malignacy and not sleeping. No SI/HI. Emotional lability and overwhelmed. Was on celexa in past which worked well.   Hyperlipidemia- on statin. No myalgia or abd pain.     Assessment/Plan:    DM type 2- just had eye exam done. Recheck labs- add amaryl 47m (or glucotrol if this is not covered)- pt asked to check blood sugar daily and if remaining over 200-250 to call sooner and we will plan to increase Amaryl to 411m   Anxiety/depression- discussed in detail- will rx celexa. Discussed risk/benefit/poss side effects. Pt would like to hold off on counselling for now. She will call for any changes or new sx  Hyperlipidemia- on statin for approx 6 months- recheck FLP and CMP.     RTC in one month     Orders Placed This Encounter   ??? METABOLIC PANEL, COMPREHENSIVE     Standing Status: Future      Number of Occurrences:       Standing Expiration Date: 03/23/2016   ??? LIPID PANEL     Standing Status: Future      Number of Occurrences:       Standing Expiration Date: 03/23/2016   ??? HEMOGLOBIN A1C WITH EAG     Standing Status: Future      Number of Occurrences:       Standing Expiration Date: 03/23/2016   ??? montelukast (SINGULAIR) 10 mg tablet     Sig: Take 10 mg by mouth daily.   ??? metFORMIN (GLUCOPHAGE) 1,000 mg tablet     Sig: Take 1 Tab by mouth two (2) times daily (with meals).     Dispense:  180 Tab     Refill:  3   ??? glimepiride (AMARYL) 2 mg tablet     Sig: Take 1 Tab by mouth every morning.     Dispense:  90 Tab     Refill:  3   ??? citalopram (CELEXA) 10 mg tablet     Sig: Take 1 Tab by mouth daily.     Dispense:  90 Tab     Refill:  3           ROS:  Negative except as mentioned above   Cardiac- no chest pain or palpitations  Pulmonary- no sob or wheezes  GI- no n/v or diarrhea.      SH:  History   Substance Use Topics   ??? Smoking status: Never Smoker    ??? Smokeless tobacco: Never Used   ??? Alcohol Use: No         Medications/Allergies:  Current Outpatient Prescriptions on File Prior to Visit   Medication Sig Dispense Refill   ??? albuterol (PROVENTIL HFA, VENTOLIN HFA, PROAIR HFA) 90 mcg/actuation inhaler Take 1 Puff by inhalation every six (6) hours as needed for Wheezing. 1 Inhaler 1   ??? atorvastatin (LIPITOR) 40 mg tablet Take 1 Tab by mouth daily. 90 Tab 3   ??? metFORMIN (GLUCOPHAGE) 1,000 mg tablet Take 2,000 mg by mouth once.  4   ??? Cetirizine (ZYRTEC) 10 mg cap Take  by mouth.     ??? cholecalciferol, vitamin D3, (VITAMIN D3) 2,000  unit tab Take  by mouth.     ??? cranberry extract 450 mg tab Take  by mouth.     ??? aspirin 81 mg chewable tablet Take 1 Tab by mouth daily. 1 Tab 0   ??? glucose blood VI test strips (FREESTYLE LITE STRIPS) strip Use to check blood sugar daily 100 Strip 11   ??? Lancets misc Use to check blood sugar daily. Dispense for Freestyle Lite system. Dispense 100 lancets 100 Each 11   ??? Blood-Glucose Meter monitoring kit Use to check blood sugar daily. Please dispense brand compatible with insurance. 1 Kit 0   ??? exenatide microspheres (BYDUREON) 2 mg serr 2 mg by SubCUTAneous route every seven (7) days. 4 Each 11     No current facility-administered medications on file prior to visit.          Allergies   Allergen Reactions   ??? Iodine Anaphylaxis   ??? Shellfish Derived Anaphylaxis   ??? Januvia [Sitagliptin] Nausea and Vomiting       Objective:  BP 135/82 mmHg   Pulse 85   Temp(Src) 97.7 ??F (36.5 ??C) (Oral)   Resp 16   Ht '5\' 2"'  (1.575 m)   Wt 204 lb (92.534 kg)   BMI 37.30 kg/m2   LMP 03/04/2015 Body mass index is 37.3 kg/(m^2).  Constitutional: Well developed, nourished, no distress, alert   CV: S1, S2.  RRR.  No murmurs/rubs.  No edema.    Pulm: No abnormalities on inspection.  Clear to auscultation bilaterally. No wheezing/rhonchi.  Normal effort.     GI: Soft, nontender, nondistended.  Normal active bowel sounds. No  masses on palpation.  No hepatosplenomegaly.

## 2015-03-23 NOTE — Patient Instructions (Signed)
Learning About Diabetes Food Guidelines  Your Care Instructions  Meal planning is important to manage diabetes. It helps keep your blood sugar at a target level (which you set with your doctor). You don't have to eat special foods. You can eat what your family eats, including sweets once in a while. But you do have to pay attention to how often you eat and how much you eat of certain foods.  You may want to work with a dietitian or a certified diabetes educator (CDE) to help you plan meals and snacks. A dietitian or CDE can also help you lose weight if that is one of your goals.  What should you know about eating carbs?  Managing the amount of carbohydrate (carbs) you eat is an important part of healthy meals when you have diabetes. Carbohydrate is found in many foods.  ?? Learn which foods have carbs. And learn the amounts of carbs in different foods.  ?? Bread, cereal, pasta, and rice have about 15 grams of carbs in a serving. A serving is 1 slice of bread (1 ounce), ?? cup of cooked cereal, or 1/3 cup of cooked pasta or rice.  ?? Fruits have 15 grams of carbs in a serving. A serving is 1 small fresh fruit, such as an apple or orange; ?? of a banana; ?? cup of cooked or canned fruit; ?? cup of fruit juice; 1 cup of melon or raspberries; or 2 tablespoons of dried fruit.  ?? Milk and no-sugar-added yogurt have 15 grams of carbs in a serving. A serving is 1 cup of milk or 2/3 cup of no-sugar-added yogurt.  ?? Starchy vegetables have 15 grams of carbs in a serving. A serving is ?? cup of mashed potatoes or sweet potato; 1 cup winter squash; ?? of a small baked potato; ?? cup of cooked beans; or ?? cup cooked corn or green peas.  ?? Learn how much carbs to eat each day and at each meal. A dietitian or CDE can teach you how to keep track of the amount of carbs you eat. This is called carbohydrate counting.  ?? If you are not sure how to count carbohydrate grams, use the Plate  Method to plan meals. It is a good, quick way to make sure that you have a balanced meal. It also helps you spread carbs throughout the day.  ?? Divide your plate by types of foods. Put non-starchy vegetables on half the plate, meat or other protein food on one-quarter of the plate, and a grain or starchy vegetable in the final quarter of the plate. To this you can add a small piece of fruit and 1 cup of milk or yogurt, depending on how many carbs you are supposed to eat at a meal.  ?? Try to eat about the same amount of carbs at each meal. Do not "save up" your daily allowance of carbs to eat at one meal.  ?? Proteins have very little or no carbs per serving. Examples of proteins are beef, chicken, turkey, fish, eggs, tofu, cheese, cottage cheese, and peanut butter. A serving size of meat is 3 ounces, which is about the size of a deck of cards. Examples of meat substitute serving sizes (equal to 1 ounce of meat) are 1/4 cup of cottage cheese, 1 egg, 1 tablespoon of peanut butter, and ?? cup of tofu.  How can you eat out and still eat healthy?  ?? Learn to estimate the serving sizes of foods that have   carbohydrate. If you measure food at home, it will be easier to estimate the amount in a serving of restaurant food.  ?? If the meal you order has too much carbohydrate (such as potatoes, corn, or baked beans), ask to have a low-carbohydrate food instead. Ask for a salad or green vegetables.  ?? If you use insulin, check your blood sugar before and after eating out to help you plan how much to eat in the future.  ?? If you eat more carbohydrate at a meal than you had planned, take a walk or do other exercise. This will help lower your blood sugar.  What else should you know?  ?? Limit saturated fat, such as the fat from meat and dairy products. This is a healthy choice because people who have diabetes are at higher risk of heart disease. So choose lean cuts of meat and nonfat or low-fat dairy  products. Use olive or canola oil instead of butter or shortening when cooking.  ?? Don't skip meals. Your blood sugar may drop too low if you skip meals and take insulin or certain medicines for diabetes.  ?? Check with your doctor before you drink alcohol. Alcohol can cause your blood sugar to drop too low. Alcohol can also cause a bad reaction if you take certain diabetes medicines.  Follow-up care is a key part of your treatment and safety. Be sure to make and go to all appointments, and call your doctor if you are having problems. It's also a good idea to know your test results and keep a list of the medicines you take.  Where can you learn more?  Go to http://www.healthwise.net/GoodHelpConnections  Enter I147 in the search box to learn more about "Learning About Diabetes Food Guidelines."  ?? 2006-2016 [employer]. Care instructions adapted under license by [employer] (which disclaims liability or warranty for this information). This care instruction is for use with your licensed healthcare professional. If you have questions about a medical condition or this instruction, always ask your healthcare professional. Healthwise, Incorporated disclaims any warranty or liability for your use of this information.  Content Version: 10.9.538570; Current as of: Jan 16, 2014        Learning About Carbohydrate  What is carbohydrate?  Carbohydrate is an important nutrient you get from food. It's a great source of energy for your body and helps your brain and nervous system work properly.  How does your body use carbohydrate?  After you eat food with carbs in it, your body digests the carbohydrate and turns it into a kind of sugar that goes into your blood. The blood carries this sugar to the cells in your body. The cells use the sugar to give you energy. Extra sugar is stored in the cells for later use. If it isn't used, it turns into fat.  Where does carbohydrate come from?   The healthiest carbohydrate choices are breads, cereals, and pastas made with whole grains; brown rice; low-fat dairy products; vegetables; legumes such as peas, lentils, and beans; and fruits.  Foods made from refined flour, including bread, pasta, doughnuts, cookies, and desserts, also contain carbohydrate. So do sweets such as candy and soda.  How can you get the right kind and amount of carbs?  Eating too much of anything can lead to weight gain. And that can lead to other health problems.  Here are some tips to help you eat the right amount of the right kind of carbs so you have the   nutrition and the energy you need:  ?? Eat 3 to 8 servings of grains (breads, cereals, rice, pasta) each day. For example, a serving is 1 slice of bread, 1 cup of boxed cereal, or ?? cup of cooked rice, cooked pasta, or cooked cereal. Go to www.choosemyplate.gov to learn how many servings you need.  ?? Buy bread that lists whole wheat (or other whole grains), stone-ground wheat, or cracked wheat as the first ingredient.  ?? Eat brown rice, bulgur, or millet instead of white rice.  ?? Eat pasta and cereals made from whole grain flour instead of refined flour.  ?? Eat several servings a day of fresh fruits and vegetables. These include raspberries, apples, figs, oranges, pears, prunes, broccoli, brussels sprouts, carrots, corn, peas, and beans. And there are lots of other fruits and vegetables to choose from.  ?? Limit the amount of candy, desserts, and soda in your diet.  Where can you learn more?  Go to http://www.healthwise.net/GoodHelpConnections  Enter F199 in the search box to learn more about "Learning About Carbohydrate."  ?? 2006-2016 [employer]. Care instructions adapted under license by [employer] (which disclaims liability or warranty for this information). This care instruction is for use with your licensed healthcare professional. If you have questions about a medical condition  or this instruction, always ask your healthcare professional. Health[employer] disclaims any warranty or liability for your use of this information.  Content Version: 10.9.538570; Current as of: July 17, 2014

## 2015-03-24 ENCOUNTER — Inpatient Hospital Stay: Admit: 2015-03-24 | Payer: BLUE CROSS/BLUE SHIELD

## 2015-03-24 DIAGNOSIS — E119 Type 2 diabetes mellitus without complications: Secondary | ICD-10-CM

## 2015-03-24 LAB — LIPID PANEL
CHOL/HDL Ratio: 3.6 (ref 0–5.0)
Cholesterol, total: 153 MG/DL (ref <200)
HDL Cholesterol: 43 MG/DL (ref 40–60)
LDL, calculated: 84 MG/DL (ref 0–100)
Triglyceride: 130 MG/DL (ref <150)
VLDL, calculated: 26 MG/DL

## 2015-03-24 LAB — METABOLIC PANEL, COMPREHENSIVE
A-G Ratio: 1.5 (ref 0.8–1.7)
ALT (SGPT): 48 U/L (ref 13–56)
AST (SGOT): 24 U/L (ref 15–37)
Albumin: 4.4 g/dL (ref 3.4–5.0)
Alk. phosphatase: 100 U/L (ref 45–117)
Anion gap: 9 mmol/L (ref 3.0–18)
BUN/Creatinine ratio: 17 (ref 12–20)
BUN: 12 MG/DL (ref 7.0–18)
Bilirubin, total: 1.2 MG/DL — ABNORMAL HIGH (ref 0.2–1.0)
CO2: 27 mmol/L (ref 21–32)
Calcium: 9.4 MG/DL (ref 8.5–10.1)
Chloride: 99 mmol/L — ABNORMAL LOW (ref 100–108)
Creatinine: 0.72 MG/DL (ref 0.6–1.3)
GFR est AA: >60 mL/min/{1.73_m2} (ref >60)
GFR est non-AA: >60 mL/min/{1.73_m2} (ref >60)
Globulin: 3 g/dL (ref 2.0–4.0)
Glucose: 281 mg/dL — ABNORMAL HIGH (ref 74–99)
Potassium: 4.4 mmol/L (ref 3.5–5.5)
Protein, total: 7.4 g/dL (ref 6.4–8.2)
Sodium: 135 mmol/L — ABNORMAL LOW (ref 136–145)

## 2015-03-24 LAB — HEMOGLOBIN A1C WITH EAG
Est. average glucose: 286 mg/dL
Hemoglobin A1c: 11.6 % — ABNORMAL HIGH (ref 4.2–5.6)

## 2015-03-30 ENCOUNTER — Encounter: Attending: Internal Medicine

## 2015-04-12 NOTE — Progress Notes (Signed)
Patient calling, reports blood sugar drop ever since she started 2 mg of Amaryl and was wondering if she should continue taking medication. Per Dr. Elmer Rampastaldo, patient should discontinue medication, monitor blood sugars and report anything over 200, and keep follow up with Dr. Elmer Rampastaldo. Patient verbalized understanding.

## 2015-05-20 ENCOUNTER — Ambulatory Visit: Admit: 2015-05-20 | Discharge: 2015-05-20 | Payer: PRIVATE HEALTH INSURANCE | Attending: Internal Medicine

## 2015-05-20 DIAGNOSIS — L03115 Cellulitis of right lower limb: Secondary | ICD-10-CM

## 2015-05-20 MED ORDER — TRIMETHOPRIM-SULFAMETHOXAZOLE 160 MG-800 MG TAB
160-800 mg | ORAL_TABLET | Freq: Two times a day (BID) | ORAL | 0 refills | Status: AC
Start: 2015-05-20 — End: 2015-05-30

## 2015-05-20 NOTE — Progress Notes (Signed)
Eileen Juarez is a 44 y.o. female and presents with Wound Infection (Right foot)       Subjective:    Hit right foot while moving fridge- shallow lac.some redness around this.   DM - doing better- father died but is starting to feel better and bs improved.   Low mcv- not anemic however. No h/o thallasemia. No cp or sob.     Assessment/Plan:    Valeri was seen today for wound infection.    Diagnoses and all orders for this visit:    Cellulitis of right lower extremity- rx for bactrim given - has tolerated this well in past.     Diabetes mellitus type 2, controlled, without complications ([place])- recheck A1C- should be better.   -     HEMOGLOBIN A1C WITH EAG; Future  -     MICROALBUMIN, UR, RAND W/ MICROALBUMIN/CREA RATIO; Future  -     CREATININE, UR, RANDOM; Future    Mixed hyperlipidemia    Low mean corpuscular volume (MCV)- check iron stores.   -     CBC W/O DIFF; Future  -     FERRITIN; Future  -     IRON PROFILE; Future    Other orders  -     trimethoprim-sulfamethoxazole (BACTRIM DS, SEPTRA DS) 160-800 mg per tablet; Take 1 Tab by mouth two (2) times a day for 10 days.      Orders Placed This Encounter   ??? HEMOGLOBIN A1C WITH EAG     Standing Status:   Future     Standing Expiration Date:   05/20/2016   ??? CBC W/O DIFF     Standing Status:   Future     Standing Expiration Date:   05/20/2016   ??? FERRITIN     Standing Status:   Future     Standing Expiration Date:   05/20/2016   ??? IRON PROFILE     Standing Status:   Future     Standing Expiration Date:   05/20/2016   ??? MICROALBUMIN, UR, RAND W/ MICROALBUMIN/CREA RATIO     Standing Status:   Future     Standing Expiration Date:   05/20/2016   ??? CREATININE, UR, RANDOM     Standing Status:   Future     Standing Expiration Date:   05/20/2016   ??? cephALEXin (KEFLEX) 500 mg capsule     Sig: Take 500 mg by mouth four (4) times daily.   ??? trimethoprim-sulfamethoxazole (BACTRIM DS, SEPTRA DS) 160-800 mg per tablet     Sig: Take 1 Tab by mouth two (2) times a day for 10 days.      Dispense:  20 Tab     Refill:  0           ROS:  Negative except as mentioned above  Cardiac- no chest pain or palpitations  Pulmonary- no sob or wheezes  GI- no n/v or diarrhea.      SH:  Social History   Substance Use Topics   ??? Smoking status: Never Smoker   ??? Smokeless tobacco: Never Used   ??? Alcohol use No         Medications/Allergies:  Current Outpatient Prescriptions on File Prior to Visit   Medication Sig Dispense Refill   ??? montelukast (SINGULAIR) 10 mg tablet Take 10 mg by mouth daily.     ??? metFORMIN (GLUCOPHAGE) 1,000 mg tablet Take 1 Tab by mouth two (2) times daily (with meals). 180 Tab 3   ???  glimepiride (AMARYL) 2 mg tablet Take 1 Tab by mouth every morning. (Patient taking differently: Take 2 mg by mouth daily. Indications: 0.5 tablet daily (61m)) 90 Tab 3   ??? citalopram (CELEXA) 10 mg tablet Take 1 Tab by mouth daily. 90 Tab 3   ??? albuterol (PROVENTIL HFA, VENTOLIN HFA, PROAIR HFA) 90 mcg/actuation inhaler Take 1 Puff by inhalation every six (6) hours as needed for Wheezing. 1 Inhaler 1   ??? atorvastatin (LIPITOR) 40 mg tablet Take 1 Tab by mouth daily. 90 Tab 3   ??? aspirin 81 mg chewable tablet Take 1 Tab by mouth daily. 1 Tab 0   ??? glucose blood VI test strips (FREESTYLE LITE STRIPS) strip Use to check blood sugar daily 100 Strip 11   ??? Lancets misc Use to check blood sugar daily. Dispense for Freestyle Lite system. Dispense 100 lancets 100 Each 11   ??? Cetirizine (ZYRTEC) 10 mg cap Take  by mouth.     ??? cholecalciferol, vitamin D3, (VITAMIN D3) 2,000 unit tab Take  by mouth.     ??? Blood-Glucose Meter monitoring kit Use to check blood sugar daily. Please dispense brand compatible with insurance. 1 Kit 0   ??? cranberry extract 450 mg tab Take  by mouth.       No current facility-administered medications on file prior to visit.           Allergies   Allergen Reactions   ??? Iodine Anaphylaxis   ??? Shellfish Derived Anaphylaxis   ??? Januvia [Sitagliptin] Nausea and Vomiting       Objective:   Visit Vitals   ??? BP 128/74   ??? Pulse 80   ??? Temp 97.7 ??F (36.5 ??C) (Oral)   ??? Resp 18   ??? Ht '5\' 2"'  (1.575 m)   ??? Wt 207 lb (93.9 kg)   ??? LMP 05/04/2015   ??? BMI 37.86 kg/m2    Body mass index is 37.86 kg/(m^2).  Constitutional: Well developed, nourished, no distress, alert   ext  right foot with shallow lac and very mild surrounding erythema. No purulent drainage.    CV: S1, S2.  RRR.  No murmurs/rubs.  No thrills palpated.  No carotid bruits.  Intact distal pulses.  No edema.   Pulm: No abnormalities on inspection.  Clear to auscultation bilaterally. No wheezing/rhonchi.  Normal effort.     GI: Soft, nontender, nondistended.  Normal active bowel sounds. No  masses on palpation.  No hepatosplenomegaly.

## 2015-05-20 NOTE — Progress Notes (Signed)
1. Have you been to the ER, urgent care clinic since your last visit?  Hospitalized since your last visit? Yes, 05/14/2015 at [facility] [address]     2. Have you seen or consulted any other health care providers outside of the [facility] since your last visit?  Include any pap smears or colon screening. No.    Patient presents with right foot infection, [facility] and was prescribed Keflex  1 tablet 4times a day.

## 2015-05-20 NOTE — Patient Instructions (Signed)
Diabetes Foot Health: Care Instructions  Your Care Instructions     When you have diabetes, your feet need extra care and attention. Diabetes can damage the nerve endings and blood vessels in your feet, making you less likely to notice when your feet are injured. Diabetes also limits your body's ability to fight infection and get blood to areas that need it. If you get a minor foot injury, it could become an ulcer or a serious infection. With good foot care, you can prevent most of these problems.  Caring for your feet can be quick and easy. Most of the care can be done when you are bathing or getting ready for bed.  Follow-up care is a key part of your treatment and safety. Be sure to make and go to all appointments, and call your doctor if you are having problems. It???s also a good idea to know your test results and keep a list of the medicines you take.  How can you care for yourself at home?  ?? Keep your blood sugar close to normal by watching what and how much you eat, monitoring blood sugar, taking medicines if prescribed, and getting regular exercise.  ?? Do not smoke. Smoking affects blood flow and can make foot problems worse. If you need help quitting, talk to your doctor about stop-smoking programs and medicines. These can increase your chances of quitting for good.  ?? Eat a diet that is low in fats. High fat intake can cause fat to build up in your blood vessels and decrease blood flow.  ?? Inspect your feet daily for blisters, cuts, cracks, or sores. If you cannot see well, use a mirror or have someone help you.  ?? Take care of your feet:  ?? Wash your feet every day. Use warm (not hot) water. Check the water temperature with your wrists or other part of your body, not your feet.  ?? Dry your feet well. Pat them dry. Do not rub the skin on your feet too hard. Dry well between your toes. If the skin on your feet stays moist, bacteria or a fungus can grow, which can lead to infection.   ?? Keep your skin soft. Use moisturizing skin cream to keep the skin on your feet soft and prevent calluses and cracks. But do not put the cream between your toes, and stop using any cream that causes a rash.  ?? Clean underneath your toenails carefully. Do not use a sharp object to clean underneath your toenails. Use the blunt end of a nail file or other rounded tool.  ?? Trim and file your toenails straight across to prevent ingrown toenails. Use a nail clipper, not scissors. Use an emery board to smooth the edges.  ?? Change socks daily. Socks without seams are best, because seams often rub the feet. You can find socks for people with diabetes from specialty catalogs.  ?? Look inside your shoes every day for things like gravel or torn linings, which could cause blisters or sores.  ?? Buy shoes that fit well:  ?? Look for shoes that have plenty of space around the toes. This helps prevent bunions and blisters.  ?? Try on shoes while wearing the kind of socks you will usually wear with the shoes.  ?? Avoid plastic shoes. They may rub your feet and cause blisters. Good shoes should be made of materials that are flexible and breathable, such as leather or cloth.  ?? Break in new shoes slowly by   wearing them for no more than an hour a day for several days. Take extra time to check your feet for red areas, blisters, or other problems after you wear new shoes.  ?? Do not go barefoot. Do not wear sandals, and do not wear shoes with very thin soles. Thin soles are easy to puncture. They also do not protect your feet from hot pavement or cold weather.  ?? Have your doctor check your feet during each visit. If you have a foot problem, see your doctor. Do not try to treat an early foot problem at home. Home remedies or treatments that you can buy without a prescription (such as corn removers) can be harmful.  ?? Always get early treatment for foot problems. A minor irritation can lead to a major problem if not properly cared for early.   When should you call for help?  Call your doctor now or seek immediate medical care if:  ?? You have a foot sore, an ulcer or break in the skin that is not healing after 4 days, bleeding corns or calluses, or an ingrown toenail.  ?? You have blue or black areas, which can mean bruising or blood flow problems.  ?? You have peeling skin or tiny blisters between your toes or cracking or oozing of the skin.  ?? You have a fever for more than 24 hours and a foot sore.  ?? You have new numbness or tingling in your feet that does not go away after you move your feet or change positions.  ?? You have unexplained or unusual swelling of the foot or ankle.  Watch closely for changes in your health, and be sure to contact your doctor if:  ?? You cannot do proper foot care.  Where can you learn more?  Go to http://www.healthwise.net/GoodHelpConnections  Enter A739 in the search box to learn more about "Diabetes Foot Health: Care Instructions."  ?? 2006-2016 [employer]. Care instructions adapted under license by [employer] (which disclaims liability or warranty for this information). This care instruction is for use with your licensed healthcare professional. If you have questions about a medical condition or this instruction, always ask your healthcare professional. [employer], [employer] disclaims any warranty or liability for your use of this information.  Content Version: 10.9.538570; Current as of: Jan 16, 2014

## 2015-07-08 ENCOUNTER — Encounter: Attending: Internal Medicine

## 2015-09-07 MED ORDER — ATORVASTATIN 40 MG TAB
40 mg | ORAL_TABLET | ORAL | 3 refills | Status: DC
Start: 2015-09-07 — End: 2016-03-14

## 2015-10-27 ENCOUNTER — Ambulatory Visit: Admit: 2015-10-27 | Discharge: 2015-10-27 | Payer: PRIVATE HEALTH INSURANCE | Attending: Internal Medicine

## 2015-10-27 ENCOUNTER — Encounter

## 2015-10-27 DIAGNOSIS — E119 Type 2 diabetes mellitus without complications: Secondary | ICD-10-CM

## 2015-10-27 LAB — AMB POC RAPID INFLUENZA TEST: QuickVue Influenza test: NEGATIVE

## 2015-10-27 NOTE — Progress Notes (Signed)
Eileen Juarez is a 45 y.o. female and presents with Generalized Body Aches; Nasal Congestion; and Sinus Pain       Subjective:    Myalgia, nasal congestion- multiple flu exposures at work. About 5 days of sx. Sneezing also. Cough is dry. Had some n/v a week ago but that resolved. No diarrhea. Some chest soreness from sneezing.   DM type 2- blood sugars "could be better".   Hyperlipidemia- on statin.     Assessment/Plan:    URI- supportive care- hold on abx for now discussed. Decongestant/fluids. Work note written.   Dm type 2- very uncontrolled- importance of keeping f/u stressed. Will likely need insulin at this point if A1c is not improved.   Hyperlipidemia- will recheck flp- continue statin.    RTC in approx 2 weeks.  With labs.   Orders Placed This Encounter   ??? METABOLIC PANEL, BASIC     Standing Status:   Future     Standing Expiration Date:   10/27/2016   ??? LIPID PANEL     Standing Status:   Future     Standing Expiration Date:   10/27/2016   ??? HEMOGLOBIN A1C WITH EAG     Standing Status:   Future     Standing Expiration Date:   10/27/2016           ROS:  Negative except as mentioned above  Cardiac- no chest pressure or palpitations  Pulmonary- no sob or wheezes  GI- no n/v or diarrhea.      SH:  Social History   Substance Use Topics   ??? Smoking status: Never Smoker   ??? Smokeless tobacco: Never Used   ??? Alcohol use No         Medications/Allergies:  Current Outpatient Prescriptions on File Prior to Visit   Medication Sig Dispense Refill   ??? atorvastatin (LIPITOR) 40 mg tablet TAKE 1 TABLET BY MOUTH ONCE DAILY 90 Tab 3   ??? montelukast (SINGULAIR) 10 mg tablet Take 10 mg by mouth daily.     ??? metFORMIN (GLUCOPHAGE) 1,000 mg tablet Take 1 Tab by mouth two (2) times daily (with meals). 180 Tab 3   ??? glimepiride (AMARYL) 2 mg tablet Take 1 Tab by mouth every morning. (Patient taking differently: Take  by mouth every morning. Indications: 0.5 tablet daily (2m)) 90 Tab 3    ??? citalopram (CELEXA) 10 mg tablet Take 1 Tab by mouth daily. 90 Tab 3   ??? albuterol (PROVENTIL HFA, VENTOLIN HFA, PROAIR HFA) 90 mcg/actuation inhaler Take 1 Puff by inhalation every six (6) hours as needed for Wheezing. 1 Inhaler 1   ??? aspirin 81 mg chewable tablet Take 1 Tab by mouth daily. 1 Tab 0   ??? glucose blood VI test strips (FREESTYLE LITE STRIPS) strip Use to check blood sugar daily 100 Strip 11   ??? Lancets misc Use to check blood sugar daily. Dispense for Freestyle Lite system. Dispense 100 lancets 100 Each 11   ??? Cetirizine (ZYRTEC) 10 mg cap Take  by mouth.     ??? cholecalciferol, vitamin D3, (VITAMIN D3) 2,000 unit tab Take  by mouth.     ??? Blood-Glucose Meter monitoring kit Use to check blood sugar daily. Please dispense brand compatible with insurance. 1 Kit 0   ??? cephALEXin (KEFLEX) 500 mg capsule Take 500 mg by mouth four (4) times daily.     ??? cranberry extract 450 mg tab Take  by mouth.  No current facility-administered medications on file prior to visit.           Allergies   Allergen Reactions   ??? Iodine Anaphylaxis   ??? Shellfish Derived Anaphylaxis   ??? Januvia [Sitagliptin] Nausea and Vomiting       Objective:  Visit Vitals   ??? BP 139/80   ??? Pulse 81   ??? Temp 98 ??F (36.7 ??C) (Oral)   ??? Resp 16   ??? Ht '5\' 2"'  (1.575 m)   ??? Wt 212 lb (96.2 kg)   ??? LMP 08/24/2015   ??? BMI 38.78 kg/m2    Body mass index is 38.78 kg/(m^2).  Constitutional: Well developed, nourished, no distress, alert   HENT: Exterior ears and tympanic membranes normal bilaterally. Supple neck. No thyromegaly or lymphadenopathy. Oropharynx clear and moist mucous membranes.  Nasal mucosa erythematous and yellow d/c.    Eyes: Conjunctiva normal. PERRL.    CV: S1, S2.  RRR.  No murmurs/rubs.  No thrills palpated.  No carotid bruits.  Intact distal pulses.  No edema.   Pulm: No abnormalities on inspection.  Clear to auscultation bilaterally. No wheezing/rhonchi.  Normal effort.      GI: Soft, nontender, nondistended.  Normal active bowel sounds. Marland Kitchen

## 2015-10-27 NOTE — Progress Notes (Signed)
1. Have you been to the ER, urgent care clinic since your last visit?  Hospitalized since your last visit? No.     2. Have you seen or consulted any other health care providers outside of the [facility] since your last visit?  Include any pap smears or colon screening. No.    Patient presents with sinus pressure, sinus congestion, headache and bodyaches.

## 2015-11-30 ENCOUNTER — Inpatient Hospital Stay: Admit: 2015-11-30 | Payer: BLUE CROSS/BLUE SHIELD

## 2015-11-30 DIAGNOSIS — E119 Type 2 diabetes mellitus without complications: Secondary | ICD-10-CM

## 2015-11-30 LAB — LIPID PANEL
CHOL/HDL Ratio: 3.8 (ref 0–5.0)
Cholesterol, total: 134 MG/DL (ref <200)
HDL Cholesterol: 35 MG/DL — ABNORMAL LOW (ref 40–60)
LDL, calculated: 76 MG/DL (ref 0–100)
Triglyceride: 115 MG/DL (ref <150)
VLDL, calculated: 23 MG/DL

## 2015-11-30 LAB — METABOLIC PANEL, BASIC
Anion gap: 13 mmol/L (ref 3.0–18)
BUN/Creatinine ratio: 11 — ABNORMAL LOW (ref 12–20)
BUN: 8 MG/DL (ref 7.0–18)
CO2: 25 mmol/L (ref 21–32)
Calcium: 9.2 MG/DL (ref 8.5–10.1)
Chloride: 101 mmol/L (ref 100–108)
Creatinine: 0.71 MG/DL (ref 0.6–1.3)
GFR est AA: >60 mL/min/{1.73_m2} (ref >60)
GFR est non-AA: >60 mL/min/{1.73_m2} (ref >60)
Glucose: 195 mg/dL — ABNORMAL HIGH (ref 74–99)
Potassium: 4.3 mmol/L (ref 3.5–5.5)
Sodium: 139 mmol/L (ref 136–145)

## 2015-11-30 LAB — HEMOGLOBIN A1C WITH EAG
Est. average glucose: 220 mg/dL
Hemoglobin A1c: 9.3 % — ABNORMAL HIGH (ref 4.2–5.6)

## 2015-12-02 MED ORDER — MONTELUKAST 10 MG TAB
10 mg | ORAL_TABLET | Freq: Every day | ORAL | 5 refills | Status: DC
Start: 2015-12-02 — End: 2016-03-14

## 2015-12-02 NOTE — Telephone Encounter (Signed)
Last appt was: 10/27/2015  Next appt is: No follow up appt.

## 2016-03-14 ENCOUNTER — Ambulatory Visit: Admit: 2016-03-14 | Discharge: 2016-03-14 | Payer: PRIVATE HEALTH INSURANCE | Attending: Internal Medicine

## 2016-03-14 DIAGNOSIS — IMO0001 Reserved for inherently not codable concepts without codable children: Secondary | ICD-10-CM

## 2016-03-14 MED ORDER — METFORMIN 1,000 MG TAB
1000 mg | ORAL_TABLET | Freq: Two times a day (BID) | ORAL | 1 refills | Status: AC
Start: 2016-03-14 — End: ?

## 2016-03-14 MED ORDER — ATORVASTATIN 40 MG TAB
40 mg | ORAL_TABLET | ORAL | 1 refills | Status: AC
Start: 2016-03-14 — End: ?

## 2016-03-14 MED ORDER — CITALOPRAM 10 MG TAB
10 mg | ORAL_TABLET | Freq: Every day | ORAL | 1 refills | Status: AC
Start: 2016-03-14 — End: ?

## 2016-03-14 MED ORDER — GLIMEPIRIDE 2 MG TAB
2 mg | ORAL_TABLET | ORAL | 1 refills | Status: AC
Start: 2016-03-14 — End: ?

## 2016-03-14 MED ORDER — MONTELUKAST 10 MG TAB
10 mg | ORAL_TABLET | Freq: Every day | ORAL | 1 refills | Status: AC
Start: 2016-03-14 — End: ?

## 2016-03-14 NOTE — Progress Notes (Signed)
Eileen Juarez is a 45 y.o. female and presents with diabetes.     Subjective:  Asking for physical but behind on DM f/u.   Diabetes- not checking bs but "they are high". Was feeling bad taking amaryl in am.   Allergic rhinitis- doing well taking singulair daily and does better taking this regulalry.   hot flashes- going through menopause- seeing gyn- started on duavee- this has helped some.  Depression- doing very well- interested in stopping celexa.      Assessment/Plan:    Dm- needs A1c done- last was 9.3%- encouraged to check bs daily and keep f/u as requested.  Allergic rhinitis- continue singulair.   bp elevated- recheck.   Depression- ok to wean off celexa- monitor hot flashes. Continue if needed.     RTC in 3 months or 1-2 weeks if hgb a1c is high.  Orders Placed This Encounter   ??? Pneumococcal polysaccharide vaccine, 23-valent, adult or immunosuppressed pt dose   ??? MICROALBUMIN, UR, RAND W/ MICROALBUMIN/CREA RATIO     Standing Status:   Future     Number of Occurrences:   1     Standing Expiration Date:   4/65/6812   ??? METABOLIC PANEL, COMPREHENSIVE     Standing Status:   Future     Number of Occurrences:   1     Standing Expiration Date:   03/15/2017   ??? HEMOGLOBIN A1C WITH EAG     Standing Status:   Future     Number of Occurrences:   1     Standing Expiration Date:   03/15/2017   ??? HM DIABETES FOOT EXAM   ??? metFORMIN (GLUCOPHAGE) 1,000 mg tablet     Sig: Take 1 Tab by mouth two (2) times daily (with meals).     Dispense:  180 Tab     Refill:  1   ??? atorvastatin (LIPITOR) 40 mg tablet     Sig: TAKE 1 TABLET BY MOUTH ONCE DAILY     Dispense:  90 Tab     Refill:  1   ??? montelukast (SINGULAIR) 10 mg tablet     Sig: Take 1 Tab by mouth daily.     Dispense:  90 Tab     Refill:  1   ??? glimepiride (AMARYL) 2 mg tablet     Sig: Take 1 Tab by mouth every morning.     Dispense:  90 Tab     Refill:  1   ??? citalopram (CELEXA) 10 mg tablet     Sig: Take 1 Tab by mouth daily.     Dispense:  90 Tab     Refill:  1   .   Eileen Juarez was seen today for complete physical.    Diagnoses and all orders for this visit:    Uncontrolled type 2 diabetes mellitus without complication, without long-term current use of insulin (HCC)  -     MICROALBUMIN, UR, RAND W/ MICROALBUMIN/CREA RATIO; Future  -     METABOLIC PANEL, COMPREHENSIVE; Future  -     HEMOGLOBIN A1C WITH EAG; Future  -     HM DIABETES FOOT EXAM    Mixed hyperlipidemia  -     METABOLIC PANEL, COMPREHENSIVE; Future    Encounter for immunization  -     Pneumococcal polysaccharide vaccine, 23-valent, adult or immunosuppressed pt dose    Depression with anxiety    Other orders  -     metFORMIN (GLUCOPHAGE) 1,000 mg tablet; Take  1 Tab by mouth two (2) times daily (with meals).  -     atorvastatin (LIPITOR) 40 mg tablet; TAKE 1 TABLET BY MOUTH ONCE DAILY  -     montelukast (SINGULAIR) 10 mg tablet; Take 1 Tab by mouth daily.  -     glimepiride (AMARYL) 2 mg tablet; Take 1 Tab by mouth every morning.  -     citalopram (CELEXA) 10 mg tablet; Take 1 Tab by mouth daily.            ROS:  Negative except as mentioned above  Cardiac- no chest pain or palpitations  Pulmonary- no sob or wheezes  GI- no n/v or diarrhea.      SH:  Social History   Substance Use Topics   ??? Smoking status: Never Smoker   ??? Smokeless tobacco: Never Used   ??? Alcohol use No         Medications/Allergies:  Current Outpatient Prescriptions on File Prior to Visit   Medication Sig Dispense Refill   ??? montelukast (SINGULAIR) 10 mg tablet Take 1 Tab by mouth daily. 30 Tab 5   ??? atorvastatin (LIPITOR) 40 mg tablet TAKE 1 TABLET BY MOUTH ONCE DAILY 90 Tab 3   ??? metFORMIN (GLUCOPHAGE) 1,000 mg tablet Take 1 Tab by mouth two (2) times daily (with meals). 180 Tab 3   ??? glimepiride (AMARYL) 2 mg tablet Take 1 Tab by mouth every morning. (Patient taking differently: Take 2 mg by mouth. Indications: 0.5 tablet daily (58m)) 90 Tab 3   ??? citalopram (CELEXA) 10 mg tablet Take 1 Tab by mouth daily. 90 Tab 3    ??? aspirin 81 mg chewable tablet Take 1 Tab by mouth daily. 1 Tab 0   ??? glucose blood VI test strips (FREESTYLE LITE STRIPS) strip Use to check blood sugar daily 100 Strip 11   ??? Lancets misc Use to check blood sugar daily. Dispense for Freestyle Lite system. Dispense 100 lancets 100 Each 11   ??? Cetirizine (ZYRTEC) 10 mg cap Take  by mouth.     ??? cholecalciferol, vitamin D3, (VITAMIN D3) 2,000 unit tab Take  by mouth.     ??? Blood-Glucose Meter monitoring kit Use to check blood sugar daily. Please dispense brand compatible with insurance. 1 Kit 0   ??? cranberry extract 450 mg tab Take  by mouth.       No current facility-administered medications on file prior to visit.           Allergies   Allergen Reactions   ??? Iodine Anaphylaxis   ??? Shellfish Derived Anaphylaxis   ??? Januvia [Sitagliptin] Nausea and Vomiting       Objective:  Visit Vitals   ??? BP 143/75   ??? Pulse 67   ??? Temp 98.2 ??F (36.8 ??C) (Oral)   ??? Resp 17   ??? Ht 5' 2" (1.575 m)   ??? Wt 215 lb 6.4 oz (97.7 kg)   ??? LMP 08/15/2015   ??? BMI 39.4 kg/m2    Body mass index is 39.4 kg/(m^2).  Constitutional: Well developed, nourished, no distress, alert   CV: S1, S2.  RRR.  No murmurs/rubs.  No thrills palpated.  No carotid bruits.  Intact distal pulses.  No edema.   Pulm: No abnormalities on inspection.  Clear to auscultation bilaterally. No wheezing/rhonchi.  Normal effort.     GI: Soft, nontender, nondistended.  Normal active bowel sounds. No  masses on palpation.  No hepatosplenomegaly.

## 2016-03-14 NOTE — Progress Notes (Signed)
1. Have you been to the ER, urgent care clinic since your last visit?  Hospitalized since your last visit? No.     2. Have you seen or consulted any other health care providers outside of [facility] since your last visit?  Include any pap smears or colon screening. Yes, Dr. Bernie CoveyHalberg (gyn) in 08/2015 for pap and [facility] and had eye exam this year too.     Patient presents with Complete Physical Exam without pap.

## 2016-03-14 NOTE — Patient Instructions (Signed)
Pneumococcal Polysaccharide Vaccine: What You Need to Know  Why get vaccinated?  Vaccination can protect older adults (and some children and younger adults) from pneumococcal disease.  Pneumococcal disease is caused by bacteria that can spread from person to person through close contact. It can cause ear infections, and it can also lead to more serious infections of the:  ?? Lungs (pneumonia),  ?? Blood (bacteremia), and  ?? Covering of the brain and spinal cord (meningitis). Meningitis can cause deafness and brain damage, and it can be fatal.  Anyone can get pneumococcal disease, but children under 2 years of age, people with certain medical conditions, adults over 65 years of age, and cigarette smokers are at the highest risk.  About 18,000 older adults die each year from pneumococcal disease in the [place].  Treatment of pneumococcal infections with penicillin and other drugs used to be more effective. But some strains of the disease have become resistant to these drugs. This makes prevention of the disease, through vaccination, even more important.  Pneumococcal polysaccharide vaccine (PPSV23)  Pneumococcal polysaccharide vaccine (PPSV23) protects against 23 types of pneumococcal bacteria. It will not prevent all pneumococcal disease.  PPSV23 is recommended for:  ?? All adults 65 years of age and older,  ?? Anyone 2 through 43 years of age with certain long-term health problems,  ?? Anyone 2 through 43 years of age with a weakened immune system,  ?? Adults 19 through 43 years of age who smoke cigarettes or have asthma.  Most people need only one dose of PPSV. A second dose is recommended for certain high-risk groups. People 65 and older should get a dose even if they have gotten one or more doses of the vaccine before they turned 65.  Your healthcare provider can give you more information about these recommendations.  Most healthy adults develop protection within 2 to 3 weeks of getting the shot.   Some people should not get this vaccine  ?? Anyone who has had a life-threatening allergic reaction to PPSV should not get another dose.  ?? Anyone who has a severe allergy to any component of PPSV should not receive it. Tell your provider if you have any severe allergies.  ?? Anyone who is moderately or severely ill when the shot is scheduled may be asked to wait until they recover before getting the vaccine. Someone with a mild illness can usually be vaccinated.  ?? Children less than 2 years of age should not receive this vaccine.  ?? There is no evidence that PPSV is harmful to either a pregnant woman or to her fetus. However, as a precaution, women who need the vaccine should be vaccinated before becoming pregnant, if possible.  Risks of a vaccine reaction  With any medicine, including vaccines, there is a chance of side effects. These are usually mild and go away on their own, but serious reactions are also possible.  About half of people who get PPSV have mild side effects, such as redness or pain where the shot is given, which go away within about two days.  Less than 1 out of 100 people develop a fever, muscle aches, or more severe local reactions.  Problems that could happen after any vaccine:  ?? People sometimes faint after a medical procedure, including vaccination. Sitting or lying down for about 15 minutes can help prevent fainting, and injuries caused by a fall. Tell your doctor if you feel dizzy, or have vision changes or ringing in the ears.  ??   Some people get severe pain in the shoulder and have difficulty moving the arm where a shot was given. This happens very rarely.  ?? Any medication can cause a severe allergic reaction. Such reactions from a vaccine are very rare, estimated at about 1 in a million doses, and would happen within a few minutes to a few hours after the vaccination.  As with any medicine, there is a very remote chance of a vaccine causing a serious injury or death.   The safety of vaccines is always being monitored. For more information, visit: www.cdc.gov/vaccinesafety/  What if there is a serious reaction?  What should I look for?  Look for anything that concerns you, such as signs of a severe allergic reaction, very high fever, or unusual behavior.  Signs of a severe allergic reaction can include hives, swelling of the face and throat, difficulty breathing, a fast heartbeat, dizziness, and weakness. These would usually start a few minutes to a few hours after the vaccination.  What should I do?  If you think it is a severe allergic reaction or other emergency that can't wait, call 9-1-1 or get to the nearest hospital. Otherwise, call your doctor.  Afterward, the reaction should be reported to the Vaccine Adverse Event Reporting System (VAERS). Your doctor might file this report, or you can do it yourself through the VAERS web site at www.vaers.hhs.gov, or by calling 1-800-822-7967.  VAERS does not give medical advice.  How can I learn more?  ?? Ask your doctor. He or she can give you the vaccine package insert or suggest other sources of information.  ?? Call your local or state health department.  ?? Contact the Centers for Disease Control and Prevention (CDC):  ?? Call 1-800-232-4636 (1-800-CDC-INFO) or  ?? Visit CDC's website at www.cdc.gov/vaccines  Vaccine Information Statement  PPSV Vaccine  (12/19/2013)  Department of Health and Human Services  Centers for Disease Control and Prevention  Many Vaccine Information Statements are available in Spanish and other languages. See www.immunize.org/vis.  Hojas de informaci??n Sobre Vacunas est??n disponibles en espa??ol y en muchos otros idiomas. Visite http://www.immunize.org/vis.  Care instructions adapted under license by [employer] (which disclaims liability or warranty for this information). If you have questions about a medical condition or this instruction, always ask your  healthcare professional. [employer] disclaims any warranty or liability for your use of this information.

## 2016-03-15 ENCOUNTER — Inpatient Hospital Stay: Admit: 2016-03-15 | Payer: BLUE CROSS/BLUE SHIELD

## 2016-03-15 DIAGNOSIS — E1165 Type 2 diabetes mellitus with hyperglycemia: Secondary | ICD-10-CM

## 2016-03-15 LAB — METABOLIC PANEL, COMPREHENSIVE
A-G Ratio: 1.3 (ref 0.8–1.7)
ALT (SGPT): 62 U/L — ABNORMAL HIGH (ref 13–56)
AST (SGOT): 44 U/L — ABNORMAL HIGH (ref 15–37)
Albumin: 3.9 g/dL (ref 3.4–5.0)
Alk. phosphatase: 89 U/L (ref 45–117)
Anion gap: 11 mmol/L (ref 3.0–18)
BUN/Creatinine ratio: 16 (ref 12–20)
BUN: 11 MG/DL (ref 7.0–18)
Bilirubin, total: 0.7 MG/DL (ref 0.2–1.0)
CO2: 24 mmol/L (ref 21–32)
Calcium: 8.8 MG/DL (ref 8.5–10.1)
Chloride: 101 mmol/L (ref 100–108)
Creatinine: 0.69 MG/DL (ref 0.6–1.3)
GFR est AA: >60 mL/min/{1.73_m2} (ref >60)
GFR est non-AA: >60 mL/min/{1.73_m2} (ref >60)
Globulin: 2.9 g/dL (ref 2.0–4.0)
Glucose: 207 mg/dL — ABNORMAL HIGH (ref 74–99)
Potassium: 4.2 mmol/L (ref 3.5–5.5)
Protein, total: 6.8 g/dL (ref 6.4–8.2)
Sodium: 136 mmol/L (ref 136–145)

## 2016-03-15 LAB — HEMOGLOBIN A1C WITH EAG
Est. average glucose: 197 mg/dL
Hemoglobin A1c: 8.5 % — ABNORMAL HIGH (ref 4.2–5.6)

## 2016-03-16 LAB — MICROALBUMIN, UR, RAND W/ MICROALB/CREAT RATIO
Creatinine, urine random: 127.15 mg/dL — ABNORMAL HIGH (ref 30–125)
Microalbumin,urine random: 1.4 MG/DL (ref 0–3.0)
Microalbumin/Creat ratio (mg/g creat): 11 mg/g (ref 0–30)

## 2016-03-24 ENCOUNTER — Institutional Professional Consult (permissible substitution): Admit: 2016-03-24 | Discharge: 2016-03-24 | Payer: PRIVATE HEALTH INSURANCE | Attending: Internal Medicine

## 2016-03-24 DIAGNOSIS — Z111 Encounter for screening for respiratory tuberculosis: Secondary | ICD-10-CM

## 2016-03-24 NOTE — Progress Notes (Signed)
PPD Placement note  Eileen Juarez, 45 y.o. female is here today for placement of PPD test  Reason for PPD test: employment  Pt taken PPD test before: yes  Verified in allergy area and with patient that they are not allergic to the products PPD is made of  Yes  Has patient taking any oral or IV steroid medication now or have they taken it in the last month? no  Has the patient ever received the BCG vaccine?: no  Has the patient been in recent contact with anyone known or suspected of having active TB disease?: no       Date of exposure (if applicable): n/a       Name of person they were exposed to (if applicable): n/a  Patient's Country of origin?: [place]  O: Alert and oriented in NAD.  P:  PPD placed on 03/24/2016.  Patient advised to return for reading within 48-72 hours.

## 2016-03-28 NOTE — Progress Notes (Signed)
PPD Reading Note, Patient had PPD placed 03/24/16 in office, read 03/27/16 by Rhea Pink, RN, [facility] [place], [place]    PPD read and results scanned into chart.   Result: 0 mm induration.  Interpretation: negative  Allergic reaction:no    Patient needs 2 step PPD.

## 2016-04-03 ENCOUNTER — Institutional Professional Consult (permissible substitution): Admit: 2016-04-03 | Payer: PRIVATE HEALTH INSURANCE | Attending: Internal Medicine

## 2016-04-03 DIAGNOSIS — Z111 Encounter for screening for respiratory tuberculosis: Secondary | ICD-10-CM

## 2016-04-03 NOTE — Progress Notes (Signed)
PPD Placement note  Gwenlyn Perkingandi Davia, 45 y.o. female is here today for placement of PPD test  Reason for PPD test: employment  Pt taken PPD test before: yes  Verified in allergy area and with patient that they are not allergic to the products PPD is made of (Phenol. Yes   Is patient taking any oral or IV steroid medication now or have they taken it in the last month? no  Has the patient ever received the BCG vaccine?: no   Has the patient been in recent contact with anyone known or suspected of having active TB disease?: no       Date of exposure (if applicable): na       Name of person they were exposed to (if applicable):na  Patient's Country of origin?: [place]  O: Alert and oriented in NAD.  P:  PPD placed on 04/03/2016.  Patient advised to return for reading within 48-72 hours.  PPD placed left forearm. Advised to return in 48-72 hours for reading.

## 2016-04-05 ENCOUNTER — Institutional Professional Consult (permissible substitution): Admit: 2016-04-05 | Discharge: 2016-04-05 | Payer: PRIVATE HEALTH INSURANCE | Attending: Internal Medicine

## 2016-04-05 DIAGNOSIS — Z111 Encounter for screening for respiratory tuberculosis: Secondary | ICD-10-CM

## 2016-04-05 LAB — AMB POC TUBERCULOSIS, INTRADERMAL (SKIN TEST)
PPD: NEGATIVE Negative
mm Induration: 0 mm

## 2016-04-05 NOTE — Progress Notes (Signed)
PPD Reading Note  PPD read and results entered in [employer].  Result: 0 mm induration.  Interpretation: negative  If test not read within 48-72 hours of initial placement, patient advised to repeat in other arm 1-3 weeks after this test.  Allergic reaction: no

## 2019-03-09 ENCOUNTER — Other Ambulatory Visit: Payer: Self-pay

## 2019-03-09 ENCOUNTER — Emergency Department
Admission: EM | Admit: 2019-03-09 | Discharge: 2019-03-09 | Disposition: A | Discharge status: Home / Self Care | Payer: Federal, State, Local not specified - PPO | Attending: Emergency Medicine | Admitting: Emergency Medicine

## 2019-03-09 ENCOUNTER — Emergency Department: Payer: Federal, State, Local not specified - PPO

## 2019-03-09 DIAGNOSIS — R1013 Epigastric pain: Secondary | ICD-10-CM | POA: Insufficient documentation

## 2019-03-09 DIAGNOSIS — I10 Essential (primary) hypertension: Secondary | ICD-10-CM | POA: Insufficient documentation

## 2019-03-09 DIAGNOSIS — E860 Dehydration: Secondary | ICD-10-CM | POA: Diagnosis not present

## 2019-03-09 DIAGNOSIS — E119 Type 2 diabetes mellitus without complications: Secondary | ICD-10-CM | POA: Insufficient documentation

## 2019-03-09 DIAGNOSIS — R1084 Generalized abdominal pain: Secondary | ICD-10-CM

## 2019-03-09 HISTORY — DX: Type 2 diabetes mellitus without complications: E11.9

## 2019-03-09 HISTORY — DX: Essential (primary) hypertension: I10

## 2019-03-09 HISTORY — DX: Pure hypercholesterolemia, unspecified: E78.00

## 2019-03-09 LAB — COMPREHENSIVE METABOLIC PANEL
ALT: 20 U/L (ref 0–44)
AST: 13 U/L — ABNORMAL LOW (ref 15–41)
Albumin: 4.6 g/dL (ref 3.5–5.0)
Alkaline Phosphatase: 96 U/L (ref 38–126)
Anion gap: 14 (ref 5–15)
BUN: 16 mg/dL (ref 6–20)
CO2: 18 mmol/L — ABNORMAL LOW (ref 22–32)
Calcium: 9.6 mg/dL (ref 8.9–10.3)
Chloride: 103 mmol/L (ref 98–111)
Creatinine, Ser: 0.6 mg/dL (ref 0.44–1.00)
GFR calc Af Amer: >60 mL/min (ref >60)
GFR calc non Af Amer: >60 mL/min (ref >60)
Glucose, Bld: 182 mg/dL — ABNORMAL HIGH (ref 70–99)
Potassium: 4.5 mmol/L (ref 3.5–5.1)
Sodium: 135 mmol/L (ref 135–145)
Total Bilirubin: 1.3 mg/dL — ABNORMAL HIGH (ref 0.3–1.2)
Total Protein: 7.7 g/dL (ref 6.5–8.1)

## 2019-03-09 LAB — URINALYSIS, COMPLETE (UACMP) WITH MICROSCOPIC
Bacteria, UA: NONE SEEN
Bilirubin Urine: NEGATIVE
Glucose, UA: >=500 mg/dL — AB
Hgb urine dipstick: NEGATIVE
Ketones, ur: 80 mg/dL — AB
Leukocytes,Ua: NEGATIVE
Nitrite: NEGATIVE
Protein, ur: NEGATIVE mg/dL
Specific Gravity, Urine: 1.039 — ABNORMAL HIGH (ref 1.005–1.030)
Squamous Epithelial / HPF: NONE SEEN (ref 0–5)
pH: 5 (ref 5.0–8.0)

## 2019-03-09 LAB — CBC
HCT: 52.9 % — ABNORMAL HIGH (ref 36.0–46.0)
Hemoglobin: 17.3 g/dL — ABNORMAL HIGH (ref 12.0–15.0)
MCH: 26 pg (ref 26.0–34.0)
MCHC: 32.7 g/dL (ref 30.0–36.0)
MCV: 79.5 fL — ABNORMAL LOW (ref 80.0–100.0)
Platelets: 424 10*3/uL — ABNORMAL HIGH (ref 150–400)
RBC: 6.65 MIL/uL — ABNORMAL HIGH (ref 3.87–5.11)
RDW: 12.8 % (ref 11.5–15.5)
WBC: 19.1 10*3/uL — ABNORMAL HIGH (ref 4.0–10.5)
nRBC: 0 % (ref 0.0–0.2)

## 2019-03-09 LAB — GASTROINTESTINAL PANEL BY PCR, STOOL (REPLACES STOOL CULTURE)

## 2019-03-09 LAB — C DIFFICILE QUICK SCREEN W PCR REFLEX
C Diff antigen: NEGATIVE
C Diff interpretation: NOT DETECTED
C Diff toxin: NEGATIVE

## 2019-03-09 LAB — TROPONIN I (HIGH SENSITIVITY): Troponin I (High Sensitivity): 5 ng/L (ref <18)

## 2019-03-09 LAB — LIPASE, BLOOD: Lipase: 29 U/L (ref 11–51)

## 2019-03-09 LAB — POCT PREGNANCY, URINE: Preg Test, Ur: NEGATIVE

## 2019-03-09 LAB — GLUCOSE, CAPILLARY: Glucose-Capillary: 94 mg/dL (ref 70–99)

## 2019-03-09 MED ORDER — KETOROLAC TROMETHAMINE 30 MG/ML IJ SOLN
15.0000 mg | Freq: Once | INTRAMUSCULAR | Status: AC
  Administered 2019-03-09: 15 mg via INTRAVENOUS
  Filled 2019-03-09: qty 1

## 2019-03-09 MED ORDER — SODIUM CHLORIDE 0.9 % IV BOLUS
1000.0000 mL | Freq: Once | INTRAVENOUS | Status: AC
  Administered 2019-03-09: 1000 mL via INTRAVENOUS

## 2019-03-09 MED ORDER — HYDROCODONE-ACETAMINOPHEN 5-325 MG PO TABS: 1.0000 | ORAL_TABLET | Freq: Four times a day (QID) | ORAL | 0 refills | Status: AC | PRN

## 2019-03-09 MED ORDER — ONDANSETRON 4 MG PO TBDP: 4.0000 mg | ORAL_TABLET | Freq: Three times a day (TID) | ORAL | 0 refills | Status: DC | PRN

## 2019-03-09 MED ORDER — PANTOPRAZOLE SODIUM 20 MG PO TBEC: 20.0000 mg | DELAYED_RELEASE_TABLET | Freq: Two times a day (BID) | ORAL | 0 refills | Status: DC

## 2019-03-09 MED ORDER — AMOXICILLIN-POT CLAVULANATE 875-125 MG PO TABS: 1.0000 | ORAL_TABLET | Freq: Two times a day (BID) | ORAL | 0 refills | Status: DC

## 2019-03-09 MED ORDER — ONDANSETRON HCL 4 MG/2ML IJ SOLN
4.0000 mg | Freq: Once | INTRAMUSCULAR | Status: AC
  Administered 2019-03-09: 08:00:00 4 mg via INTRAVENOUS
  Filled 2019-03-09: qty 2

## 2019-03-09 NOTE — ED Notes (Signed)
Vomited x3 in 24 hours Diarrhea x10+ (no recent atb usage) Pt report fever of 100.3 max Friday and then Saturday started with vomiting and diarrhea  Throughout night pt had episode of burning in chest that caused her to feel like she "could not catch my breath"

## 2019-03-09 NOTE — ED Notes (Signed)
Pt ambulatory with steady gait to stat registration to ask for a bottle of water; understands nothing to eat or drink until seen by provider

## 2019-03-09 NOTE — ED Provider Notes (Signed)
[facility] Emergency Department Provider Note  ____________________________________________   First MD Initiated Contact with Patient 03/09/19 [id number]     (approximate)  I have reviewed the triage vital signs and the nursing notes.   HISTORY  Chief Complaint Abdominal Pain and Shortness of Breath    HPI Andrea Shields is a 48 y.o. female with history of hypertension, hyperlipidemia, diabetes, here with nausea, vomiting, and fever.  The patient states that on Friday, she went to work.  She works in a Research officer, [employer] office.  They took her temperature and it was 100.3 and they sent her home.  She had a coronavirus test and results is pending.  She states she has not had any cough.  However, over the last 24 hours, she developed moderate aching, burning, cramp-like, epigastric pain.  She felt like something was "pushing" on her diaphragm and she felt short of breath throughout the night.  She was unable to sleep.  She began vomiting, nonbloody and nonbilious emesis as well.  Denies any lower abdominal pain.  She has had some moderate diarrhea with her current episode.  No specific sick contacts in the home.  She states she has a dry mouth and has been essentially unable to eat or drink throughout the day today.  She is not sure if she had a fever at home.  She is status post cholecystectomy but states she has had a retained stone in the past.  No other acute complaints.        Past Medical History:  Diagnosis Date   Diabetes mellitus without complication (HCC)    High cholesterol    Hypertension     There are no active problems to display for this patient.   Past Surgical History:  Procedure Laterality Date   CESAREAN SECTION     CHOLECYSTECTOMY      Prior to Admission medications   Medication Sig Start Date End Date Taking? Authorizing Provider  amoxicillin-clavulanate (AUGMENTIN) 875-125 MG tablet Take 1 tablet by mouth 2 (two) times daily for 10 days.  03/09/19 03/19/19  Shaune PollackIsaacs, Rosaleah Person, MD  HYDROcodone-acetaminophen (NORCO/VICODIN) 5-325 MG tablet Take 1-2 tablets by mouth every 6 (six) hours as needed for moderate pain or severe pain. 03/09/19 03/08/20  Shaune PollackIsaacs, Kortni Hasten, MD  ondansetron (ZOFRAN ODT) 4 MG disintegrating tablet Take 1 tablet (4 mg total) by mouth every 8 (eight) hours as needed for nausea or vomiting. 03/09/19   Shaune PollackIsaacs, Seara Hinesley, MD    Allergies Iodinated diagnostic agents and Shellfish allergy  No family history on file.  Social History Social History   Tobacco Use   Smoking status: Not on file  Substance Use Topics   Alcohol use: Not on file   Drug use: Not on file    Review of Systems  Review of Systems  Constitutional: Positive for fatigue and fever.  HENT: Negative for sore throat.   Eyes: Negative for visual disturbance.  Respiratory: Negative for cough and shortness of breath.   Cardiovascular: Negative for chest pain.  Gastrointestinal: Positive for abdominal pain, nausea and vomiting. Negative for diarrhea.  Genitourinary: Negative for flank pain.  Musculoskeletal: Negative for back pain and neck pain.  Skin: Negative for rash and wound.  Neurological: Positive for weakness.  All other systems reviewed and are negative.    ____________________________________________  PHYSICAL EXAM:      VITAL SIGNS: ED Triage Vitals  Enc Vitals Group     BP 03/09/19 0523 115/71     Pulse Rate 03/09/19  0523 (!) 110     Resp 03/09/19 0523 18     Temp 03/09/19 0523 98.6 F (37 C)     Temp Source 03/09/19 0523 Oral     SpO2 03/09/19 0523 98 %     Weight 03/09/19 0524 180 lb (81.6 kg)     Height 03/09/19 0524 5\' 3"  (1.6 m)     Head Circumference --      Peak Flow --      Pain Score 03/09/19 0524 5     Pain Loc --      Pain Edu? --      Excl. in GC? --      Physical Exam Vitals signs and nursing note reviewed.  Constitutional:      General: She is not in acute distress.    Appearance: She is  well-developed.  HENT:     Head: Normocephalic and atraumatic.     Mouth/Throat:     Mouth: Mucous membranes are dry.  Eyes:     Conjunctiva/sclera: Conjunctivae normal.  Neck:     Musculoskeletal: Neck supple.  Cardiovascular:     Rate and Rhythm: Normal rate and regular rhythm.     Heart sounds: Normal heart sounds. No murmur. No friction rub.  Pulmonary:     Effort: Pulmonary effort is normal. No respiratory distress.     Breath sounds: Normal breath sounds. No wheezing or rales.  Abdominal:     General: There is no distension.     Palpations: Abdomen is soft.     Tenderness: There is abdominal tenderness in the epigastric area, periumbilical area and left upper quadrant. There is no guarding or rebound.  Skin:    General: Skin is warm.     Capillary Refill: Capillary refill takes less than 2 seconds.     Findings: No rash.  Neurological:     Mental Status: She is alert and oriented to person, place, and time.     Motor: No abnormal muscle tone.       ____________________________________________   LABS (all labs ordered are listed, but only abnormal results are displayed)  Labs Reviewed  COMPREHENSIVE METABOLIC PANEL - Abnormal; Notable for the following components:      Result Value   CO2 18 (*)    Glucose, Bld 182 (*)    AST 13 (*)    Total Bilirubin 1.3 (*)    All other components within normal limits  CBC - Abnormal; Notable for the following components:   WBC 19.1 (*)    RBC 6.65 (*)    Hemoglobin 17.3 (*)    HCT 52.9 (*)    MCV 79.5 (*)    Platelets 424 (*)    All other components within normal limits  URINALYSIS, COMPLETE (UACMP) WITH MICROSCOPIC - Abnormal; Notable for the following components:   Color, Urine YELLOW (*)    APPearance HAZY (*)    Specific Gravity, Urine 1.039 (*)    Glucose, UA >=500 (*)    Ketones, ur 80 (*)    All other components within normal limits  GASTROINTESTINAL PANEL BY PCR, STOOL (REPLACES STOOL CULTURE)  C DIFFICILE  QUICK SCREEN W PCR REFLEX  LIPASE, BLOOD  GLUCOSE, CAPILLARY  POC URINE PREG, ED  POCT PREGNANCY, URINE  TROPONIN I (HIGH SENSITIVITY)  TROPONIN I (HIGH SENSITIVITY)    ____________________________________________  EKG: Sinus tachycardia, ventricular rate 114.  PR 142, QRS 74, QTc 465.  No apparent acute ischemic changes. ________________________________________  RADIOLOGY All imaging,  including plain films, CT scans, and ultrasounds, independently reviewed by me, and interpretations confirmed via formal radiology reads.  ED MD interpretation:   CT scan: No acute abnormality.  Nonobstructing mid kidney stone.  Official radiology report(s): Ct Abdomen Pelvis Wo Contrast  Result Date: 03/09/2019 CLINICAL DATA:  Abdominal Stan shin with nausea and vomiting EXAM: CT ABDOMEN AND PELVIS WITHOUT CONTRAST TECHNIQUE: Multidetector CT imaging of the abdomen and pelvis was performed following the standard protocol without oral or IV contrast. COMPARISON:  None. FINDINGS: Lower chest: Lung bases are clear. Hepatobiliary: No focal liver lesions are evident on this noncontrast enhanced study. Gallbladder is absent. There is no biliary duct dilatation. Pancreas: There is no pancreatic mass or inflammatory focus. Spleen: No splenic lesions are evident. A small accessory spleen is noted anteriorly. Adrenals/Urinary Tract: Adrenals bilaterally appear unremarkable. There is no evident renal mass or hydronephrosis on either side. There is a calculus in the mid right kidney measuring 6 x 4 mm. No other renal calculi are evident. There is no appreciable ureteral calculus on either side. Urinary bladder is midline with wall thickness within normal limits. Stomach/Bowel: There is no appreciable bowel wall or mesenteric thickening. No evident bowel obstruction. The terminal ileum appears normal. There is no free air or portal venous air. Vascular/Lymphatic: There is no abdominal aortic aneurysm. No vascular lesions  are evident on this noncontrast enhanced study. Reproductive: The uterus is retroverted. There is an intrauterine device positioned within the endometrium. There is no evident pelvic mass. Other: The appendix appears normal. There is no abscess or ascites in the abdomen or pelvis. Musculoskeletal: There is slight anterior wedging several lower thoracic vertebral bodies. No blastic or lytic bone lesions are evident. There is no intramuscular or abdominal wall lesion. IMPRESSION: 1. Nonobstructing calculus measuring 6 x 4 mm in the mid right kidney. No hydronephrosis or ureteral calculus on either side. 2. No bowel obstruction. No abscess in the abdomen or pelvis. Appendix appears normal. 3. Retroverted uterus. Intrauterine device positioned within the endometrium. 4.  Gallbladder absent. Electronically Signed   By: Lowella Grip III M.D.   On: 03/09/2019 08:55    ____________________________________________  PROCEDURES   Procedure(s) performed (including Critical Care):  Procedures  ____________________________________________  INITIAL IMPRESSION / MDM / [place] / ED COURSE  As part of my medical decision making, I reviewed the following data within the electronic MEDICAL RECORD NUMBER Notes from prior ED visits and [place] Controlled Substance Database      *Jorgina Binning was evaluated in Emergency Department on 03/09/2019 for the symptoms described in the history of present illness. She was evaluated in the context of the global COVID-19 pandemic, which necessitated consideration that the patient might be at risk for infection with the SARS-CoV-2 virus that causes COVID-19. Institutional protocols and algorithms that pertain to the evaluation of patients at risk for COVID-19 are in a state of rapid change based on information released by regulatory bodies including the CDC and federal and state organizations. These policies and algorithms were followed during the patient's care in the ED.   Some ED evaluations and interventions may be delayed as a result of limited staffing during the pandemic.*   Clinical Course as of Mar 08 1252  Sun Mar 09, 2019  0748 48 yo F here with fever, n/v, diarrhea. DDx includes diverticulitis, choledocholithiasis, viral illness, colitis, gastroenteritis. EKG non-ischemic, trop neg, doubt ACS or pericarditis. Lungs CTAB but will check for basilar PNA on CT. Labs reviewed, show +  leukocytosis, thrombocytopsis, bicarb 18 with mild hyperbili but normal LFTs. CT scan pending. Labs c/w hemoconcentration and dehydration, IVF given.   [CI]  1206 Patient looks and feels significantly better after fluids.  GI panel negative.  C. difficile negative.  She appears significantly dehydrated on lab work with ketonuria, but is been given 2 L fluid and is tolerating p.o. now without difficulty.  CMP also consistent with mild dehydration.  No surgical abnormality noted on CT.  Of note, she did have some blood in her stool.  Suspect this could be secondary to colitis, though peptic ulcer is also a consideration.  She has no evidence of perforation or complication on CT.  Will start on antacids, empiric antibiotics, and refer for outpatient follow-up.  There are no secondary changes to suggest an ischemic colitis and she denies any significant pain on my exam.  She will follow-up with her PCP, who she works for, [employer].   [CI]    Clinical Course User Index [CI] Shaune PollackIsaacs, Kaliyan Osbourn, MD    Medical Decision Making: 48 year old female here with low-grade temperature, leukocytosis, and abdominal pain.  She also had some incidental note of melena, and has heme positive stool.  Differential includes ulcer disease, upper colitis, gastritis, less likely acute H. pylori infection given labs and normal chloride and travel history.  She is markedly well-appearing after IV fluids and antiemetics and is tolerating p.o.  Discussed case with her PCP.  She will be scheduled for an EGD tomorrow.  I  think this is reasonable.  Will start her on aggressive antiacid regimen, as well as treat with antibiotics for possible concomitant colitis.  ____________________________________________  FINAL CLINICAL IMPRESSION(S) / ED DIAGNOSES  Final diagnoses:  Generalized abdominal pain  Dehydration     MEDICATIONS GIVEN DURING THIS VISIT:  Medications  sodium chloride 0.9 % bolus 1,000 mL (0 mLs Intravenous Stopped 03/09/19 1042)  sodium chloride 0.9 % bolus 1,000 mL (0 mLs Intravenous Stopped 03/09/19 0920)  ondansetron (ZOFRAN) injection 4 mg (4 mg Intravenous Given 03/09/19 0809)  ketorolac (TORADOL) 30 MG/ML injection 15 mg (15 mg Intravenous Given 03/09/19 1043)     ED Discharge Orders         Ordered    amoxicillin-clavulanate (AUGMENTIN) 875-125 MG tablet  2 times daily,   Status:  Discontinued     03/09/19 1237    ondansetron (ZOFRAN ODT) 4 MG disintegrating tablet  Every 8 hours PRN     03/09/19 1237    HYDROcodone-acetaminophen (NORCO/VICODIN) 5-325 MG tablet  Every 6 hours PRN     03/09/19 1237    amoxicillin-clavulanate (AUGMENTIN) 875-125 MG tablet  2 times daily     03/09/19 1241           Note:  This document was prepared using Dragon voice recognition software and may include unintentional dictation errors.   Shaune PollackIsaacs, Danise Dehne, MD 03/09/19 1314

## 2019-03-09 NOTE — ED Notes (Signed)
Pt c/o "sugar dropping" - CBG done and cbg wnl

## 2019-03-09 NOTE — ED Triage Notes (Signed)
Pt states was tested for COVID after developing a fever on Friday. Pt states yesterday began to have upper abd pain with vomiting and diarrhea. Pt states tonight shob. Pt able to speak in full sentences and appears in no acute distress.

## 2019-03-09 NOTE — Discharge Instructions (Addendum)
Take the antibiotic as prescribed  For your possible ulcer, I recommend you take over-the-counter omeprazole 20 mg twice a day.  You can supplement this with famotidine/Pepcid once a day.  Take this at least once daily until you follow-up with GI.

## 2019-03-13 ENCOUNTER — Encounter: Payer: Self-pay | Admitting: Emergency Medicine

## 2019-03-13 ENCOUNTER — Emergency Department
Admission: EM | Admit: 2019-03-13 | Discharge: 2019-03-13 | Disposition: A | Discharge status: Home / Self Care | Payer: Federal, State, Local not specified - PPO | Attending: Emergency Medicine | Admitting: Emergency Medicine

## 2019-03-13 ENCOUNTER — Other Ambulatory Visit: Payer: Self-pay

## 2019-03-13 DIAGNOSIS — R109 Unspecified abdominal pain: Secondary | ICD-10-CM | POA: Insufficient documentation

## 2019-03-13 DIAGNOSIS — Z5321 Procedure and treatment not carried out due to patient leaving prior to being seen by health care provider: Secondary | ICD-10-CM | POA: Diagnosis not present

## 2019-03-13 LAB — URINALYSIS, COMPLETE (UACMP) WITH MICROSCOPIC
Bacteria, UA: NONE SEEN
Bilirubin Urine: NEGATIVE
Glucose, UA: >=500 mg/dL — AB
Hgb urine dipstick: NEGATIVE
Ketones, ur: 80 mg/dL — AB
Leukocytes,Ua: NEGATIVE
Nitrite: NEGATIVE
Protein, ur: NEGATIVE mg/dL
Specific Gravity, Urine: 1.031 — ABNORMAL HIGH (ref 1.005–1.030)
pH: 5 (ref 5.0–8.0)

## 2019-03-13 LAB — COMPREHENSIVE METABOLIC PANEL
ALT: 26 U/L (ref 0–44)
AST: 28 U/L (ref 15–41)
Albumin: 4.6 g/dL (ref 3.5–5.0)
Alkaline Phosphatase: 83 U/L (ref 38–126)
Anion gap: 15 (ref 5–15)
BUN: 8 mg/dL (ref 6–20)
CO2: 23 mmol/L (ref 22–32)
Calcium: 9.9 mg/dL (ref 8.9–10.3)
Chloride: 98 mmol/L (ref 98–111)
Creatinine, Ser: 0.58 mg/dL (ref 0.44–1.00)
GFR calc Af Amer: >60 mL/min (ref >60)
GFR calc non Af Amer: >60 mL/min (ref >60)
Glucose, Bld: 121 mg/dL — ABNORMAL HIGH (ref 70–99)
Potassium: 3.8 mmol/L (ref 3.5–5.1)
Sodium: 136 mmol/L (ref 135–145)
Total Bilirubin: 1 mg/dL (ref 0.3–1.2)
Total Protein: 7.8 g/dL (ref 6.5–8.1)

## 2019-03-13 LAB — CBC
HCT: 48.7 % — ABNORMAL HIGH (ref 36.0–46.0)
Hemoglobin: 16.2 g/dL — ABNORMAL HIGH (ref 12.0–15.0)
MCH: 26.2 pg (ref 26.0–34.0)
MCHC: 33.3 g/dL (ref 30.0–36.0)
MCV: 78.8 fL — ABNORMAL LOW (ref 80.0–100.0)
Platelets: 293 10*3/uL (ref 150–400)
RBC: 6.18 MIL/uL — ABNORMAL HIGH (ref 3.87–5.11)
RDW: 13.2 % (ref 11.5–15.5)
WBC: 17.7 10*3/uL — ABNORMAL HIGH (ref 4.0–10.5)
nRBC: 0 % (ref 0.0–0.2)

## 2019-03-13 LAB — LIPASE, BLOOD: Lipase: 36 U/L (ref 11–51)

## 2019-03-13 LAB — POCT PREGNANCY, URINE: Preg Test, Ur: NEGATIVE

## 2019-03-13 NOTE — ED Triage Notes (Signed)
Patient states she was seen in the ED on Sunday and she had abdominal pain, vomiting, diarrhea and fever.  Patient states yesterday her lab work was normal.  Patient states today, around noon she began to have squeezing pain to her mid abdomen and vomiting.  Patient reports vomiting x 3 and diarrhea x 6 since 3pm.

## 2019-03-13 NOTE — ED Notes (Signed)
Patient seen leaving lobby 

## 2019-03-13 NOTE — ED Notes (Signed)
Patient called, no answer. Not seen in lobby, bathroom or outside.  °

## 2019-03-13 NOTE — ED Notes (Signed)
Patient up to stat desk asking about wait times. This RN explained triage wait process, and that there was 1 person waiting ahead of patient to go back. Patient verbalized understanding.

## 2019-03-14 ENCOUNTER — Other Ambulatory Visit: Payer: Self-pay

## 2019-03-14 ENCOUNTER — Emergency Department: Payer: Federal, State, Local not specified - PPO

## 2019-03-14 ENCOUNTER — Inpatient Hospital Stay
Admission: EM | Admit: 2019-03-14 | Discharge: 2019-03-15 | DRG: 379 | Disposition: A | Discharge status: Home / Self Care | Payer: Federal, State, Local not specified - PPO | Attending: Internal Medicine | Admitting: Internal Medicine

## 2019-03-14 ENCOUNTER — Encounter: Payer: Self-pay | Admitting: Emergency Medicine

## 2019-03-14 DIAGNOSIS — Z888 Allergy status to other drugs, medicaments and biological substances status: Secondary | ICD-10-CM

## 2019-03-14 DIAGNOSIS — E878 Other disorders of electrolyte and fluid balance, not elsewhere classified: Secondary | ICD-10-CM | POA: Diagnosis present

## 2019-03-14 DIAGNOSIS — Z87892 Personal history of anaphylaxis: Secondary | ICD-10-CM

## 2019-03-14 DIAGNOSIS — Z91013 Allergy to seafood: Secondary | ICD-10-CM

## 2019-03-14 DIAGNOSIS — R197 Diarrhea, unspecified: Secondary | ICD-10-CM

## 2019-03-14 DIAGNOSIS — K254 Chronic or unspecified gastric ulcer with hemorrhage: Secondary | ICD-10-CM | POA: Diagnosis present

## 2019-03-14 DIAGNOSIS — Z20828 Contact with and (suspected) exposure to other viral communicable diseases: Secondary | ICD-10-CM | POA: Diagnosis present

## 2019-03-14 DIAGNOSIS — K922 Gastrointestinal hemorrhage, unspecified: Secondary | ICD-10-CM | POA: Diagnosis present

## 2019-03-14 DIAGNOSIS — D500 Iron deficiency anemia secondary to blood loss (chronic): Secondary | ICD-10-CM | POA: Diagnosis present

## 2019-03-14 DIAGNOSIS — I10 Essential (primary) hypertension: Secondary | ICD-10-CM | POA: Diagnosis present

## 2019-03-14 DIAGNOSIS — K264 Chronic or unspecified duodenal ulcer with hemorrhage: Secondary | ICD-10-CM | POA: Diagnosis not present

## 2019-03-14 DIAGNOSIS — R1010 Upper abdominal pain, unspecified: Secondary | ICD-10-CM

## 2019-03-14 DIAGNOSIS — Z975 Presence of (intrauterine) contraceptive device: Secondary | ICD-10-CM

## 2019-03-14 DIAGNOSIS — Z833 Family history of diabetes mellitus: Secondary | ICD-10-CM

## 2019-03-14 DIAGNOSIS — Z91041 Radiographic dye allergy status: Secondary | ICD-10-CM

## 2019-03-14 DIAGNOSIS — E119 Type 2 diabetes mellitus without complications: Secondary | ICD-10-CM | POA: Diagnosis present

## 2019-03-14 DIAGNOSIS — D72829 Elevated white blood cell count, unspecified: Secondary | ICD-10-CM

## 2019-03-14 DIAGNOSIS — Z79899 Other long term (current) drug therapy: Secondary | ICD-10-CM

## 2019-03-14 DIAGNOSIS — Z9049 Acquired absence of other specified parts of digestive tract: Secondary | ICD-10-CM

## 2019-03-14 DIAGNOSIS — E785 Hyperlipidemia, unspecified: Secondary | ICD-10-CM | POA: Diagnosis present

## 2019-03-14 LAB — CBC WITH DIFFERENTIAL/PLATELET
Abs Immature Granulocytes: 0.09 10*3/uL — ABNORMAL HIGH (ref 0.00–0.07)
Basophils Absolute: 0.1 10*3/uL (ref 0.0–0.1)
Basophils Relative: 1 %
Eosinophils Absolute: 2.7 10*3/uL — ABNORMAL HIGH (ref 0.0–0.5)
Eosinophils Relative: 17 %
HCT: 47.5 % — ABNORMAL HIGH (ref 36.0–46.0)
Hemoglobin: 15.7 g/dL — ABNORMAL HIGH (ref 12.0–15.0)
Immature Granulocytes: 1 %
Lymphocytes Relative: 20 %
Lymphs Abs: 3.1 10*3/uL (ref 0.7–4.0)
MCH: 25.8 pg — ABNORMAL LOW (ref 26.0–34.0)
MCHC: 33.1 g/dL (ref 30.0–36.0)
MCV: 78.1 fL — ABNORMAL LOW (ref 80.0–100.0)
Monocytes Absolute: 1 10*3/uL (ref 0.1–1.0)
Monocytes Relative: 6 %
Neutro Abs: 8.5 10*3/uL — ABNORMAL HIGH (ref 1.7–7.7)
Neutrophils Relative %: 55 %
Platelets: 298 10*3/uL (ref 150–400)
RBC: 6.08 MIL/uL — ABNORMAL HIGH (ref 3.87–5.11)
RDW: 13.2 % (ref 11.5–15.5)
WBC: 15.4 10*3/uL — ABNORMAL HIGH (ref 4.0–10.5)
nRBC: 0 % (ref 0.0–0.2)

## 2019-03-14 LAB — URINALYSIS, COMPLETE (UACMP) WITH MICROSCOPIC
Bacteria, UA: NONE SEEN
Bilirubin Urine: NEGATIVE
Glucose, UA: >=500 mg/dL — AB
Hgb urine dipstick: NEGATIVE
Ketones, ur: 20 mg/dL — AB
Leukocytes,Ua: NEGATIVE
Nitrite: NEGATIVE
Protein, ur: NEGATIVE mg/dL
Specific Gravity, Urine: 1.026 (ref 1.005–1.030)
pH: 5 (ref 5.0–8.0)

## 2019-03-14 LAB — COMPREHENSIVE METABOLIC PANEL
ALT: 29 U/L (ref 0–44)
AST: 26 U/L (ref 15–41)
Albumin: 4.5 g/dL (ref 3.5–5.0)
Alkaline Phosphatase: 77 U/L (ref 38–126)
Anion gap: 13 (ref 5–15)
BUN: 9 mg/dL (ref 6–20)
CO2: 28 mmol/L (ref 22–32)
Calcium: 10 mg/dL (ref 8.9–10.3)
Chloride: 95 mmol/L — ABNORMAL LOW (ref 98–111)
Creatinine, Ser: 0.65 mg/dL (ref 0.44–1.00)
GFR calc Af Amer: >60 mL/min (ref >60)
GFR calc non Af Amer: >60 mL/min (ref >60)
Glucose, Bld: 94 mg/dL (ref 70–99)
Potassium: 4.1 mmol/L (ref 3.5–5.1)
Sodium: 136 mmol/L (ref 135–145)
Total Bilirubin: 1.1 mg/dL (ref 0.3–1.2)
Total Protein: 7.7 g/dL (ref 6.5–8.1)

## 2019-03-14 LAB — SARS CORONAVIRUS 2 BY RT PCR (HOSPITAL ORDER, PERFORMED IN ~~LOC~~ HOSPITAL LAB): SARS Coronavirus 2: NEGATIVE

## 2019-03-14 LAB — POCT PREGNANCY, URINE: Preg Test, Ur: NEGATIVE

## 2019-03-14 LAB — PATHOLOGIST SMEAR REVIEW: Path Review: INCREASED

## 2019-03-14 LAB — PREGNANCY, URINE: Preg Test, Ur: NEGATIVE

## 2019-03-14 LAB — LIPASE, BLOOD: Lipase: 34 U/L (ref 11–51)

## 2019-03-14 MED ORDER — ACETAMINOPHEN 500 MG PO TABS
1000.0000 mg | ORAL_TABLET | Freq: Once | ORAL | Status: AC
  Administered 2019-03-14: 21:00:00 1000 mg via ORAL
  Filled 2019-03-14: qty 2

## 2019-03-14 NOTE — ED Triage Notes (Signed)
Patient reports she was seen last Sunday for abdominal pain, N/V/D. Reports ongoing symptoms since then. Patient states she came for reevaluation but left prior to being seen due to discomfort. Patient reports she was called by her PCP and encouraged to come back due to persistent elevated WBCs. Patient reports some improvement of nausea and vomiting but states diarrhea has continued and is now very dark.

## 2019-03-14 NOTE — Discharge Instructions (Addendum)
Please continue the stomach acid blockers on the antibiotics.  Please return for any worsening pain fever vomiting or feeling sicker.  Please continue follow-up with your doctor.  I would at some point get an upper GI endoscopy.  This may be due to an ulcer or other gastric or upper intestinal irritation.  The ultrasound today was negative as was the CT on your last ER visit.

## 2019-03-14 NOTE — ED Provider Notes (Addendum)
[facility] Emergency Department Provider Note   ____________________________________________   First MD Initiated Contact with Patient 03/14/19 1542     (approximate)  I have reviewed the triage vital signs and the nursing notes.   HISTORY  Chief Complaint Abdominal pain  HPI Andrea Shields is a 48 y.o. female who was seen a few days ago for diarrhea.  CT was negative white count was elevated patient was started on empiric antibiotics and acid blockers.  Patient reports she went 2 days ago and had blood work done in the office and the white count then was 10,000 yesterday it was 17,000 the pain had gotten worse and she had come back here but left because the wait was too long I understand and today she came back again because her doctor sent her in a white count is gone down to 15,000.  Pain is mostly epigastric today is a little bit lower than that but still midline above the umbilicus and it is much better than it was yesterday.  She still having diarrhea.  Stool was negative for C. difficile and any other pathogens on her test initially.  It was Hemoccult positive initially.  Is still dark.  I have not done a rectal exam on the patient today.         Past Medical History:  Diagnosis Date  . Diabetes mellitus without complication (HCC)   . High cholesterol   . Hypertension     There are no active problems to display for this patient.   Past Surgical History:  Procedure Laterality Date  . CESAREAN SECTION    . CHOLECYSTECTOMY      Prior to Admission medications   Medication Sig Start Date End Date Taking? Authorizing Provider  amoxicillin-clavulanate (AUGMENTIN) 875-125 MG tablet Take 1 tablet by mouth 2 (two) times daily for 10 days. 03/09/19 03/19/19  Shaune PollackIsaacs, Cameron, MD  HYDROcodone-acetaminophen (NORCO/VICODIN) 5-325 MG tablet Take 1-2 tablets by mouth every 6 (six) hours as needed for moderate pain or severe pain. 03/09/19 03/08/20  Shaune PollackIsaacs,  Cameron, MD  ondansetron (ZOFRAN ODT) 4 MG disintegrating tablet Take 1 tablet (4 mg total) by mouth every 8 (eight) hours as needed for nausea or vomiting. 03/09/19   Shaune PollackIsaacs, Cameron, MD  pantoprazole (PROTONIX) 20 MG tablet Take 1 tablet (20 mg total) by mouth 2 (two) times daily for 10 days. 03/09/19 03/19/19  Shaune PollackIsaacs, Cameron, MD    Allergies Iodinated diagnostic agents and Shellfish allergy  No family history on file.  Social History Social History   Tobacco Use  . Smoking status: Never Smoker  . Smokeless tobacco: Never Used  Substance Use Topics  . Alcohol use: Not on file  . Drug use: Not on file    Review of Systems  Constitutional: No fever/chills Eyes: No visual changes. ENT: No sore throat. Cardiovascular: Denies chest pain. Respiratory: Denies shortness of breath. Gastrointestinal:  abdominal pain.  No nausea, no vomiting.   diarrhea.  No constipation. Genitourinary: Negative for dysuria. Musculoskeletal: Negative for back pain. Skin: Negative for rash. Neurological: Negative for headaches, focal weakness   ____________________________________________   PHYSICAL EXAM:  VITAL SIGNS: ED Triage Vitals  Enc Vitals Group     BP 03/14/19 1334 118/78     Pulse Rate 03/14/19 1334 99     Resp 03/14/19 1334 16     Temp 03/14/19 1334 98.4 F (36.9 C)     Temp Source 03/14/19 1334 Oral     SpO2 03/14/19 1334  99 %     Weight 03/14/19 1335 180 lb 12.4 oz (82 kg)     Height 03/14/19 1335 5\' 3"  (1.6 m)     Head Circumference --      Peak Flow --      Pain Score 03/14/19 1335 2     Pain Loc --      Pain Edu? --      Excl. in [place]? --     Constitutional: Alert and oriented. Well appearing and in no acute distress. Eyes: Conjunctivae are normal.  Head: Atraumatic. Nose: No congestion/rhinnorhea. Mouth/Throat: Mucous membranes are moist.  Oropharynx non-erythematous. Neck: No stridor. Cardiovascular: Normal rate, regular rhythm. Grossly normal heart sounds.  Good  peripheral circulation. Respiratory: Normal respiratory effort.  No retractions. Lungs CTAB. Gastrointestinal: Soft tender in the epigastrium. No distention. No abdominal bruits. No CVA tenderness. Musculoskeletal: No lower extremity tenderness nor edema.  No joint effusions. Neurologic:  Normal speech and language. No gross focal neurologic deficits are appreciated.  Skin:  Skin is warm, dry and intact. No rash noted. Psychiatric: Mood and affect are normal. Speech and behavior are normal.  ____________________________________________   LABS (all labs ordered are listed, but only abnormal results are displayed)  Labs Reviewed  COMPREHENSIVE METABOLIC PANEL - Abnormal; Notable for the following components:      Result Value   Chloride 95 (*)    All other components within normal limits  CBC WITH DIFFERENTIAL/PLATELET - Abnormal; Notable for the following components:   WBC 15.4 (*)    RBC 6.08 (*)    Hemoglobin 15.7 (*)    HCT 47.5 (*)    MCV 78.1 (*)    MCH 25.8 (*)    Neutro Abs 8.5 (*)    Eosinophils Absolute 2.7 (*)    Abs Immature Granulocytes 0.09 (*)    All other components within normal limits  URINALYSIS, COMPLETE (UACMP) WITH MICROSCOPIC - Abnormal; Notable for the following components:   Color, Urine YELLOW (*)    APPearance HAZY (*)    Glucose, UA >=500 (*)    Ketones, ur 20 (*)    All other components within normal limits  GASTROINTESTINAL PANEL BY PCR, STOOL (REPLACES STOOL CULTURE)  C DIFFICILE QUICK SCREEN W PCR REFLEX  OVA + PARASITE EXAM  LIPASE, BLOOD  PREGNANCY, URINE  PATHOLOGIST SMEAR REVIEW  POCT PREGNANCY, URINE   ____________________________________________  EKG   ____________________________________________  RADIOLOGY  ED MD interpretation: Ultrasound read by radiology is negative for any explanation for her pain  Official radiology report(s): US Abdomen Complete  Result Date: 03/14/2019 CLINICAL DATA:  Upper abdominal pain. Elevated  white blood cell count. EXAM: ABDOMEN ULTRASOUND COMPLETE COMPARISON:  CT 5 days ago FINDINGS: Gallbladder: Surgically absent. Common bile duct: Diameter: 6 mm, normal. Liver: No focal lesion identified. Mild diffusely increased in parenchymal echogenicity. Portal vein is patent on color Doppler imaging with normal direction of blood flow towards the liver. IVC: No abnormality visualized. Pancreas: Visualized portion unremarkable. Spleen: Size and appearance within normal limits. Right Kidney: Length: 11.4 cm. Echogenicity within normal limits. Nonobstructing 7 mm stone in the mid kidney. No mass or hydronephrosis visualized. Left Kidney: Length: 11.1 cm. Echogenicity within normal limits. No mass or hydronephrosis visualized. Abdominal aorta: No aneurysm visualized. Other findings: None.  No ascites. IMPRESSION: 1. No acute findings or explanation for abdominal pain. 2. Nonobstructing right renal stone. 3. Mild hepatic steatosis. Electronically Signed   By: Keith Rake M.D.   On: 03/14/2019 19:50  ____________________________________________   PROCEDURES  Procedure(s) performed (including Critical Care):  Procedures   ____________________________________________   INITIAL IMPRESSION / ASSESSMENT AND PLAN / ED COURSE Discussed patient with Dr. Agustin Creeahilaini on call for GI.  She wants me to repeat the C. difficile and stool panel.  Additionally we will get a ova and parasite exam.  She will see the patient in the morning.  She may want a hematology consult as the patient's H&H started at 52.9 and now is 49.  She may want to do a upper endoscopy.  For this reason she wants me to make the patient clear liquids now and n.p.o. after midnight.  Patient also wants to come in to try and figure out what is going on with her.       Andrea Shields was evaluated in Emergency Department on 03/14/2019 for the symptoms described in the history of present illness. She was evaluated in the context of the global  COVID-19 pandemic, which necessitated consideration that the patient might be at risk for infection with the SARS-CoV-2 virus that causes COVID-19. Institutional protocols and algorithms that pertain to the evaluation of patients at risk for COVID-19 are in a state of rapid change based on information released by regulatory bodies including the CDC and federal and state organizations. These policies and algorithms were followed during the patient's care in the ED.       ____________________________________________   FINAL CLINICAL IMPRESSION(S) / ED DIAGNOSES  Final diagnoses:  Pain of upper abdomen  Leukocytosis, unspecified type  Diarrhea, unspecified type     ED Discharge Orders    None       Note:  This document was prepared using Dragon voice recognition software and may include unintentional dictation errors.    Arnaldo NatalMalinda, Paul F, MD 03/14/19 2012    Arnaldo NatalMalinda, Paul F, MD 03/14/19 2014

## 2019-03-15 ENCOUNTER — Encounter
Admission: EM | Disposition: A | Discharge status: Home / Self Care | Payer: Self-pay | Source: Home / Self Care | Attending: Internal Medicine

## 2019-03-15 ENCOUNTER — Inpatient Hospital Stay: Payer: Federal, State, Local not specified - PPO | Admitting: Anesthesiology

## 2019-03-15 ENCOUNTER — Other Ambulatory Visit: Payer: Self-pay

## 2019-03-15 DIAGNOSIS — K254 Chronic or unspecified gastric ulcer with hemorrhage: Secondary | ICD-10-CM | POA: Diagnosis present

## 2019-03-15 DIAGNOSIS — R1010 Upper abdominal pain, unspecified: Secondary | ICD-10-CM | POA: Diagnosis present

## 2019-03-15 DIAGNOSIS — K3189 Other diseases of stomach and duodenum: Secondary | ICD-10-CM | POA: Diagnosis not present

## 2019-03-15 DIAGNOSIS — Z9049 Acquired absence of other specified parts of digestive tract: Secondary | ICD-10-CM | POA: Diagnosis not present

## 2019-03-15 DIAGNOSIS — E785 Hyperlipidemia, unspecified: Secondary | ICD-10-CM | POA: Diagnosis present

## 2019-03-15 DIAGNOSIS — Z20828 Contact with and (suspected) exposure to other viral communicable diseases: Secondary | ICD-10-CM | POA: Diagnosis present

## 2019-03-15 DIAGNOSIS — K921 Melena: Secondary | ICD-10-CM | POA: Diagnosis not present

## 2019-03-15 DIAGNOSIS — E878 Other disorders of electrolyte and fluid balance, not elsewhere classified: Secondary | ICD-10-CM | POA: Diagnosis present

## 2019-03-15 DIAGNOSIS — R1013 Epigastric pain: Secondary | ICD-10-CM | POA: Diagnosis not present

## 2019-03-15 DIAGNOSIS — Z91013 Allergy to seafood: Secondary | ICD-10-CM | POA: Diagnosis not present

## 2019-03-15 DIAGNOSIS — Z87892 Personal history of anaphylaxis: Secondary | ICD-10-CM | POA: Diagnosis not present

## 2019-03-15 DIAGNOSIS — Z91041 Radiographic dye allergy status: Secondary | ICD-10-CM | POA: Diagnosis not present

## 2019-03-15 DIAGNOSIS — K264 Chronic or unspecified duodenal ulcer with hemorrhage: Secondary | ICD-10-CM | POA: Diagnosis present

## 2019-03-15 DIAGNOSIS — I10 Essential (primary) hypertension: Secondary | ICD-10-CM | POA: Diagnosis present

## 2019-03-15 DIAGNOSIS — R197 Diarrhea, unspecified: Secondary | ICD-10-CM | POA: Diagnosis not present

## 2019-03-15 DIAGNOSIS — K922 Gastrointestinal hemorrhage, unspecified: Secondary | ICD-10-CM | POA: Diagnosis present

## 2019-03-15 DIAGNOSIS — Z888 Allergy status to other drugs, medicaments and biological substances status: Secondary | ICD-10-CM | POA: Diagnosis not present

## 2019-03-15 DIAGNOSIS — Z833 Family history of diabetes mellitus: Secondary | ICD-10-CM | POA: Diagnosis not present

## 2019-03-15 DIAGNOSIS — Z975 Presence of (intrauterine) contraceptive device: Secondary | ICD-10-CM | POA: Diagnosis not present

## 2019-03-15 DIAGNOSIS — D500 Iron deficiency anemia secondary to blood loss (chronic): Secondary | ICD-10-CM | POA: Diagnosis present

## 2019-03-15 DIAGNOSIS — Z79899 Other long term (current) drug therapy: Secondary | ICD-10-CM | POA: Diagnosis not present

## 2019-03-15 DIAGNOSIS — E119 Type 2 diabetes mellitus without complications: Secondary | ICD-10-CM | POA: Diagnosis present

## 2019-03-15 HISTORY — PX: COLONOSCOPY WITH PROPOFOL: SHX5780

## 2019-03-15 HISTORY — PX: ESOPHAGOGASTRODUODENOSCOPY: SHX5428

## 2019-03-15 LAB — GASTROINTESTINAL PANEL BY PCR, STOOL (REPLACES STOOL CULTURE)

## 2019-03-15 LAB — RETICULOCYTES
Immature Retic Fract: 10.1 % (ref 2.3–15.9)
RBC.: 5.43 MIL/uL — ABNORMAL HIGH (ref 3.87–5.11)
Retic Count, Absolute: 80.4 10*3/uL (ref 19.0–186.0)
Retic Ct Pct: 1.5 % (ref 0.4–3.1)

## 2019-03-15 LAB — CBC
HCT: 42.5 % (ref 36.0–46.0)
Hemoglobin: 14.1 g/dL (ref 12.0–15.0)
MCH: 26 pg (ref 26.0–34.0)
MCHC: 33.2 g/dL (ref 30.0–36.0)
MCV: 78.4 fL — ABNORMAL LOW (ref 80.0–100.0)
Platelets: 260 10*3/uL (ref 150–400)
RBC: 5.42 MIL/uL — ABNORMAL HIGH (ref 3.87–5.11)
RDW: 13.2 % (ref 11.5–15.5)
WBC: 14.7 10*3/uL — ABNORMAL HIGH (ref 4.0–10.5)
nRBC: 0 % (ref 0.0–0.2)

## 2019-03-15 LAB — GLUCOSE, CAPILLARY
Glucose-Capillary: 112 mg/dL — ABNORMAL HIGH (ref 70–99)
Glucose-Capillary: 101 mg/dL — ABNORMAL HIGH (ref 70–99)
Glucose-Capillary: 154 mg/dL — ABNORMAL HIGH (ref 70–99)

## 2019-03-15 LAB — COMPREHENSIVE METABOLIC PANEL
ALT: 27 U/L (ref 0–44)
AST: 23 U/L (ref 15–41)
Albumin: 3.9 g/dL (ref 3.5–5.0)
Alkaline Phosphatase: 66 U/L (ref 38–126)
Anion gap: 8 (ref 5–15)
BUN: 10 mg/dL (ref 6–20)
CO2: 28 mmol/L (ref 22–32)
Calcium: 9.1 mg/dL (ref 8.9–10.3)
Chloride: 100 mmol/L (ref 98–111)
Creatinine, Ser: 0.64 mg/dL (ref 0.44–1.00)
GFR calc Af Amer: >60 mL/min (ref >60)
GFR calc non Af Amer: >60 mL/min (ref >60)
Glucose, Bld: 120 mg/dL — ABNORMAL HIGH (ref 70–99)
Potassium: 3.9 mmol/L (ref 3.5–5.1)
Sodium: 136 mmol/L (ref 135–145)
Total Bilirubin: 0.6 mg/dL (ref 0.3–1.2)
Total Protein: 6.7 g/dL (ref 6.5–8.1)

## 2019-03-15 LAB — IRON AND TIBC
Iron: 33 ug/dL (ref 28–170)
Saturation Ratios: 11 % (ref 10.4–31.8)
TIBC: 301 ug/dL (ref 250–450)
UIBC: 268 ug/dL

## 2019-03-15 LAB — C DIFFICILE QUICK SCREEN W PCR REFLEX
C Diff antigen: NEGATIVE
C Diff interpretation: NOT DETECTED
C Diff toxin: NEGATIVE

## 2019-03-15 LAB — HEMOGLOBIN AND HEMATOCRIT, BLOOD
HCT: 42.1 % (ref 36.0–46.0)
HCT: 39.1 % (ref 36.0–46.0)
Hemoglobin: 14 g/dL (ref 12.0–15.0)
Hemoglobin: 12.9 g/dL (ref 12.0–15.0)

## 2019-03-15 LAB — PROTIME-INR
INR: 1 (ref 0.8–1.2)
Prothrombin Time: 13 seconds (ref 11.4–15.2)

## 2019-03-15 LAB — FOLATE: Folate: 45 ng/mL (ref >5.9)

## 2019-03-15 LAB — LACTATE DEHYDROGENASE: LDH: 133 U/L (ref 98–192)

## 2019-03-15 LAB — VITAMIN B12: Vitamin B-12: 884 pg/mL (ref 180–914)

## 2019-03-15 LAB — FERRITIN: Ferritin: 50 ng/mL (ref 11–307)

## 2019-03-15 SURGERY — EGD (ESOPHAGOGASTRODUODENOSCOPY)
Anesthesia: Monitor Anesthesia Care

## 2019-03-15 MED ORDER — FENTANYL CITRATE (PF) 100 MCG/2ML IJ SOLN
INTRAMUSCULAR | Status: DC | PRN
  Administered 2019-03-15: 50 ug via INTRAVENOUS

## 2019-03-15 MED ORDER — SODIUM CHLORIDE 0.9 % IV SOLN
INTRAVENOUS | Status: DC
  Administered 2019-03-15: 05:00:00 via INTRAVENOUS

## 2019-03-15 MED ORDER — EMPAGLIFLOZIN 25 MG PO TABS: 25.0000 mg | ORAL_TABLET | Freq: Every day | ORAL | Status: DC

## 2019-03-15 MED ORDER — ACETAMINOPHEN 325 MG PO TABS
650.0000 mg | ORAL_TABLET | Freq: Four times a day (QID) | ORAL | Status: DC | PRN
  Administered 2019-03-15: 14:00:00 650 mg via ORAL
  Filled 2019-03-15: qty 2

## 2019-03-15 MED ORDER — PROPOFOL 500 MG/50ML IV EMUL
INTRAVENOUS | Status: AC
  Filled 2019-03-15: qty 50

## 2019-03-15 MED ORDER — HYDROCODONE-ACETAMINOPHEN 5-325 MG PO TABS: 1.0000 | ORAL_TABLET | Freq: Four times a day (QID) | ORAL | Status: DC | PRN

## 2019-03-15 MED ORDER — SODIUM CHLORIDE 0.9 % IV SOLN
8.0000 mg/h | INTRAVENOUS | Status: DC
  Administered 2019-03-15: 06:00:00 8 mg/h via INTRAVENOUS
  Filled 2019-03-15: qty 80

## 2019-03-15 MED ORDER — PANTOPRAZOLE SODIUM 20 MG PO TBEC
20.0000 mg | DELAYED_RELEASE_TABLET | Freq: Every day | ORAL | Status: DC
  Administered 2019-03-15: 20 mg via ORAL
  Filled 2019-03-15: qty 1

## 2019-03-15 MED ORDER — ACETAMINOPHEN 650 MG RE SUPP: 650.0000 mg | Freq: Four times a day (QID) | RECTAL | Status: DC | PRN

## 2019-03-15 MED ORDER — PANTOPRAZOLE SODIUM 40 MG IV SOLR: 40.0000 mg | Freq: Two times a day (BID) | INTRAVENOUS | Status: DC

## 2019-03-15 MED ORDER — LISINOPRIL 20 MG PO TABS
20.0000 mg | ORAL_TABLET | Freq: Every day | ORAL | Status: DC
  Administered 2019-03-15: 14:00:00 20 mg via ORAL
  Filled 2019-03-15: qty 1

## 2019-03-15 MED ORDER — SODIUM CHLORIDE 0.9 % IV SOLN
80.0000 mg | Freq: Once | INTRAVENOUS | Status: AC
  Administered 2019-03-15: 80 mg via INTRAVENOUS
  Filled 2019-03-15: qty 80

## 2019-03-15 MED ORDER — PROPOFOL 500 MG/50ML IV EMUL
INTRAVENOUS | Status: DC | PRN
  Administered 2019-03-15: 150 ug/kg/min via INTRAVENOUS

## 2019-03-15 MED ORDER — ONDANSETRON 4 MG PO TBDP: 4.0000 mg | ORAL_TABLET | Freq: Three times a day (TID) | ORAL | Status: DC | PRN

## 2019-03-15 MED ORDER — ONDANSETRON HCL 4 MG/2ML IJ SOLN: 4.0000 mg | Freq: Four times a day (QID) | INTRAMUSCULAR | Status: DC | PRN

## 2019-03-15 MED ORDER — TRAZODONE HCL 50 MG PO TABS: 25.0000 mg | ORAL_TABLET | Freq: Every evening | ORAL | Status: DC | PRN

## 2019-03-15 MED ORDER — LACTATED RINGERS IV SOLN
INTRAVENOUS | Status: DC | PRN
  Administered 2019-03-15: 12:00:00 via INTRAVENOUS

## 2019-03-15 MED ORDER — PANTOPRAZOLE SODIUM 40 MG PO TBEC: 40.0000 mg | DELAYED_RELEASE_TABLET | Freq: Two times a day (BID) | ORAL | 0 refills | Status: DC

## 2019-03-15 MED ORDER — MAGNESIUM HYDROXIDE 400 MG/5ML PO SUSP: 30.0000 mL | Freq: Every day | ORAL | Status: DC | PRN

## 2019-03-15 MED ORDER — INSULIN ASPART 100 UNIT/ML ~~LOC~~ SOLN: 0.0000 [IU] | Freq: Three times a day (TID) | SUBCUTANEOUS | Status: DC

## 2019-03-15 MED ORDER — ONDANSETRON HCL 4 MG PO TABS: 4.0000 mg | ORAL_TABLET | Freq: Four times a day (QID) | ORAL | Status: DC | PRN

## 2019-03-15 MED ORDER — MONTELUKAST SODIUM 10 MG PO TABS
10.0000 mg | ORAL_TABLET | Freq: Every day | ORAL | Status: DC
  Administered 2019-03-15: 10 mg via ORAL
  Filled 2019-03-15: qty 1

## 2019-03-15 MED ORDER — FENTANYL CITRATE (PF) 100 MCG/2ML IJ SOLN
INTRAMUSCULAR | Status: AC
  Filled 2019-03-15: qty 2

## 2019-03-15 MED ORDER — LIDOCAINE HCL (CARDIAC) PF 100 MG/5ML IV SOSY
PREFILLED_SYRINGE | INTRAVENOUS | Status: DC | PRN
  Administered 2019-03-15: 50 mg via INTRAVENOUS

## 2019-03-15 MED ORDER — SEMAGLUTIDE (1 MG/DOSE) 2 MG/1.5ML ~~LOC~~ SOPN: 1.0000 mg | PEN_INJECTOR | SUBCUTANEOUS | Status: DC

## 2019-03-15 MED ORDER — VITAMIN B-12 1000 MCG PO TABS
1000.0000 ug | ORAL_TABLET | Freq: Every day | ORAL | Status: DC
  Administered 2019-03-15: 14:00:00 1000 ug via ORAL
  Filled 2019-03-15: qty 1

## 2019-03-15 NOTE — Transfer of Care (Signed)
Immediate Anesthesia Transfer of Care Note  Patient: Andrea Shields  Procedure(s) Performed: ESOPHAGOGASTRODUODENOSCOPY (EGD) (N/A ) COLONOSCOPY WITH PROPOFOL (N/A )  Patient Location: PACU  Anesthesia Type:MAC  Level of Consciousness: awake  Airway & Oxygen Therapy: Patient Spontanous Breathing  Post-op Assessment: Report given to RN  Post vital signs: stable  Last Vitals:  Vitals Value Taken Time  BP    Temp    Pulse    Resp    SpO2      Last Pain:  Vitals:   03/15/19 1007  TempSrc: Temporal  PainSc: 0-No pain         Complications: No apparent anesthesia complications

## 2019-03-15 NOTE — Progress Notes (Signed)
   03/15/19 1200  Clinical Encounter Type  Visited With Patient not available  Visit Type Initial  Referral From Nurse   Chaplain received an OR to complete or update an AD. Patient away from her room at this time. Will attempt again at a later time.

## 2019-03-15 NOTE — Op Note (Addendum)
[facility] Gastroenterology Patient Name: Andrea PerkingCandi Pain Procedure Date: 03/15/2019 11:17 AM MRN: [id number] Account #: [id number] Date of Birth: 04-02-1971 Admit Type: Inpatient Age: [id number] [facility]OM 4 Gender: Female Note Status: Finalized Procedure:            Upper GI endoscopy Indications:          Epigastric abdominal pain Providers:            Antwyne Pingree B. Maximino Greenlandahiliani MD, MD Medicines:            Monitored Anesthesia Care Complications:        No immediate complications. Procedure:            Pre-Anesthesia Assessment:                       - Prior to the procedure, a History and Physical was                        performed, and patient medications, allergies and                        sensitivities were reviewed. The patient's tolerance of                        previous anesthesia was reviewed.                       - The risks and benefits of the procedure and the                        sedation options and risks were discussed with the                        patient. All questions were answered and informed                        consent was obtained.                       - Patient identification and proposed procedure were                        verified prior to the procedure by the physician, the                        nurse, the anesthesiologist, the anesthetist and the                        technician. The procedure was verified in the procedure                        room.                       - ASA Grade Assessment: II - A patient with mild                        systemic disease.                       After obtaining informed consent, the endoscope was  passed under direct vision. Throughout the procedure,                        the patient's blood pressure, pulse, and oxygen                        saturations were monitored continuously. The Endoscope                        was introduced through the mouth, and  advanced to the                        third part of duodenum. The upper GI endoscopy was                        accomplished with ease. The patient tolerated the                        procedure well. Findings:      The examined esophagus was normal.      Patchy mildly erythematous mucosa without bleeding was found in the       gastric antrum. Biopsies were taken with a cold forceps for histology.       Biopsies were obtained in the gastric body, at the incisura and in the       gastric antrum with cold forceps for histology.      The duodenal bulb was normal.      Patchy mild mucosal changes characterized by discoloration were found in       the second portion of the duodenum. Biopsies were taken with a cold       forceps for histology. Impression:           - Normal esophagus.                       - Erythematous mucosa in the antrum. Biopsied.                       - Normal duodenal bulb.                       - Mucosal changes in the duodenum. Biopsied.                       - Biopsies were obtained in the gastric body, at the                        incisura and in the gastric antrum. Recommendation:       - Await pathology results.                       - Advance diet as tolerated.                       - Continue present medications.                       - Patient has a contact number available for                        emergencies. The signs and symptoms of potential  delayed complications were discussed with the patient.                        Return to normal activities tomorrow. Written discharge                        instructions were provided to the patient.                       - The findings and recommendations were discussed with                        the patient. Procedure Code(s):    --- Professional ---                       [phone], Esophagogastroduodenoscopy, flexible, transoral;                        with biopsy, single or  multiple Diagnosis Code(s):    --- Professional ---                       K31.89, Other diseases of stomach and duodenum                       R10.13, Epigastric pain CPT copyright 2019 [employer]. All rights reserved. The codes documented in this report are preliminary and upon coder review may  be revised to meet current compliance requirements.  Vonda Antigua, MD Margretta Sidle B. Bonna Gains MD, MD 03/15/2019 11:55:38 AM This report has been signed electronically. Number of Addenda: 0 Note Initiated On: 03/15/2019 11:17 AM Scope Withdrawal Time: 0 hours 7 minutes 12 seconds  Total Procedure Duration: 0 hours 15 minutes 29 seconds  Estimated Blood Loss: Estimated blood loss: none.      [facility]

## 2019-03-15 NOTE — Anesthesia Post-op Follow-up Note (Signed)
Anesthesia QCDR form completed.        

## 2019-03-15 NOTE — H&P (Signed)
[facility] NAME: Andrea Shields    MR#:  [id number]  DATE OF BIRTH:  [birth year]  DATE OF ADMISSION:  03/14/2019  PRIMARY CARE PHYSICIAN: System, Pcp Not In   REQUESTING/REFERRING PHYSICIAN: Kandis Mannan, MD  CHIEF COMPLAINT:   Chief Complaint  Patient presents with  . Abdominal Pain    HISTORY OF PRESENT ILLNESS:  Andrea Shields  is a 48 y.o. Caucasian female with a known history of diabetes mellitus, hypertension dyslipidemia, who presented to the emergency room with acute onset of epigastric abdominal pain which has been going on for the last week as well as melanotic stools.  She admitted to nausea and vomiting without bilious vomitus or hematemesis.  She has been having recurrent diarrhea over the last week with loose and watery bowel movements.  No dyspnea or cough or wheezing or hemoptysis.  No other bleeding diathesis.  She denied any recent sick exposures.  No dysuria, oliguria or hematuria or flank pain.  She denied any fever or chills.  Upon presentation to the emergency room, blood pressure was 120/86 and dropped briefly to 98/56 with otherwise normal vital signs.  Labs revealed H&H of 15.7 and 47.5 compared with hemoglobin of 16.2 and hematocrit 48.7 yesterday today down from hemoglobin of 17.3 and hematocrit of 52.9 on 03/09/2019.  WBCs were 15.4 with neutrophilia compared to 17.7 yesterday and 19.1 on 7/12 with platelets of 298.  CMP was remarkable for mild hypo-chloremia.  Urinalysis showed more than 500 glucose and 20 ketones.  Urine pregnancy test came back negative.  COVID-19 test came back negative.  The patient had the ER visit and an abdominal CT scan on 03/09/2019 that showed absent gallbladder, nonobstructing calculus measuring 6X 4 mm in the mid right kidney with no hydronephrosis or ureteral calculus on either side.  There was no bowel obstruction.  Appendix was normal.  She had a retroverted uterus with IUD  positioned within the endometrium.  The patient was given 1 g of p.o. Tylenol here in the ER.  Dr. Bonna Gains was consulted over the phone and will be seeing the patient.  She will be admitted a medical monitored bed for further evaluation and management.  PAST MEDICAL HISTORY:   Past Medical History:  Diagnosis Date  . Diabetes mellitus without complication ([place])   . High cholesterol   . Hypertension     PAST SURGICAL HISTORY:   Past Surgical History:  Procedure Laterality Date  . CESAREAN SECTION    . CHOLECYSTECTOMY      SOCIAL HISTORY:   Social History   Tobacco Use  . Smoking status: Never Smoker  . Smokeless tobacco: Never Used  Substance Use Topics  . Alcohol use: Not on file    FAMILY HISTORY:  Positive for diabetes mellitus and cancer in her father.  DRUG ALLERGIES:   Allergies  Allergen Reactions  . Iodinated Diagnostic Agents Anaphylaxis  . Shellfish Allergy   . Sitagliptin Nausea And Vomiting    REVIEW OF SYSTEMS:   ROS As per history of present illness. All pertinent systems were reviewed above. Constitutional,  HEENT, cardiovascular, respiratory, GI, GU, musculoskeletal, neuro, psychiatric, endocrine,  integumentary and hematologic systems were reviewed and are otherwise  negative/unremarkable except for positive findings mentioned above in the HPI.   MEDICATIONS AT HOME:   Prior to Admission medications   Medication Sig Start Date End Date Taking? Authorizing Provider  acidophilus (RISAQUAD) CAPS capsule Take 1 capsule by  mouth daily.   Yes [provider]  amoxicillin-clavulanate (AUGMENTIN) 875-125 MG tablet Take 1 tablet by mouth 2 (two) times daily for 10 days. 03/09/19 03/19/19 Yes Shaune PollackIsaacs, Cameron, MD  empagliflozin (JARDIANCE) 25 MG TABS tablet Take 25 mg by mouth daily.   Yes [provider]  HYDROcodone-acetaminophen (NORCO/VICODIN) 5-325 MG tablet Take 1-2 tablets by mouth every 6 (six) hours as needed for moderate  pain or severe pain. 03/09/19 03/08/20 Yes Shaune PollackIsaacs, Cameron, MD  lisinopril (ZESTRIL) 20 MG tablet Take 20 mg by mouth daily. 07/30/18  Yes [provider]  metFORMIN (GLUCOPHAGE) 1000 MG tablet Take 500 mg by mouth 2 (two) times a day.   Yes [provider]  montelukast (SINGULAIR) 10 MG tablet Take 10 mg by mouth daily. 10/06/18  Yes [provider]  pantoprazole (PROTONIX) 20 MG tablet Take 1 tablet (20 mg total) by mouth 2 (two) times daily for 10 days. 03/09/19 03/19/19 Yes Shaune PollackIsaacs, Cameron, MD  Semaglutide, 1 MG/DOSE, (OZEMPIC, 1 MG/DOSE,) 2 MG/1.5ML SOPN Inject 1 mg into the skin every 7 (seven) days.   Yes [provider]  vitamin B-12 (CYANOCOBALAMIN) 1000 MCG tablet Take 1,000 mcg by mouth daily.   Yes [provider]  ondansetron (ZOFRAN ODT) 4 MG disintegrating tablet Take 1 tablet (4 mg total) by mouth every 8 (eight) hours as needed for nausea or vomiting. 03/09/19   Shaune PollackIsaacs, Cameron, MD      VITAL SIGNS:  Blood pressure (!) 97/55, pulse 64, temperature 98.2 F (36.8 C), temperature source Oral, resp. rate 14, height 5\' 3"  (1.6 m), weight 82 kg, SpO2 96 %.  PHYSICAL EXAMINATION:  Physical Exam  GENERAL:  48 y.o.-year-old Caucasian female patient lying in the bed with no acute distress.  EYES: Pupils equal, round, reactive to light and accommodation. No scleral icterus. Extraocular muscles intact.  HEENT: Head atraumatic, normocephalic. Oropharynx and nasopharynx clear.  NECK:  Supple, no jugular venous distention. No thyroid enlargement, no tenderness.  LUNGS: Normal breath sounds bilaterally, no wheezing, rales,rhonchi or crepitation. No use of accessory muscles of respiration.  CARDIOVASCULAR: Regular rate and rhythm, S1, S2 normal. No murmurs, rubs, or gallops.  ABDOMEN: Soft with epigastric abdominal tenderness without rebound tenderness guarding or rigidity.  Bowel sounds present. No organomegaly or mass.  EXTREMITIES: No pedal edema,  cyanosis, or clubbing.  NEUROLOGIC: Cranial nerves II through XII are intact. Muscle strength 5/5 in all extremities. Sensation intact. Gait not checked.  PSYCHIATRIC: The patient is alert and oriented x 3.  Normal affect and good eye contact. SKIN: No obvious rash, lesion, or ulcer.   LABORATORY PANEL:   CBC Recent Labs  Lab 03/14/19 1622  WBC 15.4*  HGB 15.7*  HCT 47.5*  PLT 298   ------------------------------------------------------------------------------------------------------------------  Chemistries  Recent Labs  Lab 03/14/19 1622  NA 136  K 4.1  CL 95*  CO2 28  GLUCOSE 94  BUN 9  CREATININE 0.65  CALCIUM 10.0  AST 26  ALT 29  ALKPHOS 77  BILITOT 1.1   ------------------------------------------------------------------------------------------------------------------  Cardiac Enzymes No results for input(s): TROPONINI in the last 168 hours. ------------------------------------------------------------------------------------------------------------------  RADIOLOGY:  [place] Abdomen Complete  Result Date: 03/14/2019 CLINICAL DATA:  Upper abdominal pain. Elevated white blood cell count. EXAM: ABDOMEN ULTRASOUND COMPLETE COMPARISON:  CT 5 days ago FINDINGS: Gallbladder: Surgically absent. Common bile duct: Diameter: 6 mm, normal. Liver: No focal lesion identified. Mild diffusely increased in parenchymal echogenicity. Portal vein is patent on color Doppler imaging with normal direction of  blood flow towards the liver. IVC: No abnormality visualized. Pancreas: Visualized portion unremarkable. Spleen: Size and appearance within normal limits. Right Kidney: Length: 11.4 cm. Echogenicity within normal limits. Nonobstructing 7 mm stone in the mid kidney. No mass or hydronephrosis visualized. Left Kidney: Length: 11.1 cm. Echogenicity within normal limits. No mass or hydronephrosis visualized. Abdominal aorta: No aneurysm visualized. Other findings: None.  No ascites. IMPRESSION:  1. No acute findings or explanation for abdominal pain. 2. Nonobstructing right renal stone. 3. Mild hepatic steatosis. Electronically Signed   By: Narda RutherfordMelanie  Sanford M.D.   On: 03/14/2019 19:50      IMPRESSION AND PLAN:   1.  GI bleeding likely of upper GI etiology especially given epigastric pain.  Patient will be admitted to a medical monitored bed.  Will follow serial hemoglobin and hematocrit.  We will place her on IV Protonix bolus and drip.  A GI consultation will be obtained by Dr. Maximino Greenlandahiliani who was notified and is aware about the patient.  We will keep her n.p.o. for the possibility of EGD this morning.  2.  Relative microcytic anemia likely secondary to blood loss from GI bleeding.  She will be monitored with serial hemoglobin and hematocrits.  She will be typed and screened.  At this time she does not need packed red blood cells transfusion.  We will still obtain further anemia work-up.  3.  Nausea and vomiting and diarrhea that could be related to acute gastroenteritis.  Given leukocytosis this could be of infectious etiology.  We will obtain stool studies and place her on IV hydration with normal saline for now.  Her leukocytosis has been improving over the last couple of days.  I will hold off on IV antibiotic therapy at this time.  4. Type 2 diabetes mellitus.  The patient will be placed on supplemental coverage with NovoLog.  5.  Hypertension.  We will continue lisinopril.  6.  DVT prophylaxis.  This will be provided with SCDs.  Medical prophylaxis is currently contraindicated secondary to GI bleeding.   All the records are reviewed and case discussed with ED provider. The plan of care was discussed in details with the patient (and family). I answered all questions. The patient agreed to proceed with the above mentioned plan. Further management will depend upon hospital course.   CODE STATUS: Full code  TOTAL TIME TAKING CARE OF THIS PATIENT: 55 minutes.    Hannah BeatJan A Keziyah Kneale M.D  on 03/15/2019 at 4:05 AM  Pager - [phone]  After 6pm go to [email or web address] - Social research officer, governmentpassword [email or web address] [email or web address]  [facility]  Office  [phone]  CC: Primary care physician; System, Pcp Not In   Note: This dictation was prepared with Dragon dictation along with smaller phrase technology. Any transcriptional errors that result from this process are unintentional.

## 2019-03-15 NOTE — Progress Notes (Signed)
Musette Sherrard to be D/C'd Home per MD order.  Discussed prescriptions and follow up appointments with the patient. Prescriptions given to patient, medication list explained in detail. Pt verbalized understanding.  Allergies as of 03/15/2019      Reactions   Iodinated Diagnostic Agents Anaphylaxis   Shellfish Allergy    Sitagliptin Nausea And Vomiting      Medication List    STOP taking these medications   amoxicillin-clavulanate 875-125 MG tablet Commonly known as: Augmentin     TAKE these medications   acidophilus Caps capsule Take 1 capsule by mouth daily.   HYDROcodone-acetaminophen 5-325 MG tablet Commonly known as: NORCO/VICODIN Take 1-2 tablets by mouth every 6 (six) hours as needed for moderate pain or severe pain.   Jardiance 25 MG Tabs tablet Generic drug: empagliflozin Take 25 mg by mouth daily.   lisinopril 20 MG tablet Commonly known as: ZESTRIL Take 20 mg by mouth daily.   metFORMIN 1000 MG tablet Commonly known as: GLUCOPHAGE Take 500 mg by mouth 2 (two) times a day.   montelukast 10 MG tablet Commonly known as: SINGULAIR Take 10 mg by mouth daily.   ondansetron 4 MG disintegrating tablet Commonly known as: Zofran ODT Take 1 tablet (4 mg total) by mouth every 8 (eight) hours as needed for nausea or vomiting.   Ozempic (1 MG/DOSE) 2 MG/1.5ML Sopn Generic drug: Semaglutide (1 MG/DOSE) Inject 1 mg into the skin every 7 (seven) days.   pantoprazole 40 MG tablet Commonly known as: Protonix Take 1 tablet (40 mg total) by mouth 2 (two) times daily for 10 days. What changed:   medication strength  how much to take   vitamin B-12 1000 MCG tablet Commonly known as: CYANOCOBALAMIN Take 1,000 mcg by mouth daily.       Vitals:   03/15/19 1316 03/15/19 1405  BP: 111/64 122/65  Pulse: 71 62  Resp: 14   Temp:  97.8 F (36.6 C)  SpO2: 99% 100%    Skin clean, dry and intact without evidence of skin break down, no evidence of skin tears noted. IV  catheter discontinued intact. Site without signs and symptoms of complications. Dressing and pressure applied. Pt denies pain at this time. No complaints noted.  An After Visit Summary was printed and given to the patient. Patient escorted via [place], and D/C home via private auto.  Fuller Mandril, RN

## 2019-03-15 NOTE — Anesthesia Preprocedure Evaluation (Addendum)
Anesthesia Evaluation  Patient identified by MRN, date of birth, ID band Patient awake    Reviewed: Allergy & Precautions, H&P , NPO status , reviewed documented beta blocker date and time   Airway Mallampati: II  TM Distance: >3 FB Neck ROM: full    Dental  (+) Teeth Intact   Pulmonary    Pulmonary exam normal        Cardiovascular hypertension, Normal cardiovascular exam     Neuro/Psych    GI/Hepatic   Endo/Other  diabetes  Renal/GU      Musculoskeletal   Abdominal   Peds  Hematology   Anesthesia Other Findings Past Medical History: No date: Diabetes mellitus without complication ([place]) No date: High cholesterol No date: Hypertension  Past Surgical History: No date: CESAREAN SECTION No date: CHOLECYSTECTOMY  BMI    Body Mass Index: 31.13 kg/m      Reproductive/Obstetrics                            Anesthesia Physical Anesthesia Plan  ASA: II and emergent  Anesthesia Plan: General   Post-op Pain Management:    Induction: Intravenous  PONV Risk Score and Plan: 3 and TIVA  Airway Management Planned: Nasal Cannula and Natural Airway  Additional Equipment:   Intra-op Plan:   Post-operative Plan:   Informed Consent: I have reviewed the patients History and Physical, chart, labs and discussed the procedure including the risks, benefits and alternatives for the proposed anesthesia with the patient or authorized representative who has indicated his/her understanding and acceptance.     Dental Advisory Given  Plan Discussed with: CRNA  Anesthesia Plan Comments:         Anesthesia Quick Evaluation

## 2019-03-15 NOTE — ED Notes (Signed)
ED TO INPATIENT HANDOFF REPORT  ED Nurse Name and Phone #: Duwaine Maxin Name/Age/Gender [facility] 48 y.o. female Room/Bed: ED12A/ED12A  Code Status   Code Status: Full Code  Home/SNF/Other Home Patient oriented to: self, place, time and situation Is this baseline? Yes   Triage Complete: Triage complete  Chief Complaint abdominal pain  Triage Note Patient reports she was seen last Sunday for abdominal pain, N/V/D. Reports ongoing symptoms since then. Patient states she came for reevaluation but left prior to being seen due to discomfort. Patient reports she was called by her PCP and encouraged to come back due to persistent elevated WBCs. Patient reports some improvement of nausea and vomiting but states diarrhea has continued and is now very dark.    Allergies Allergies  Allergen Reactions  . Iodinated Diagnostic Agents Anaphylaxis  . Shellfish Allergy   . Sitagliptin Nausea And Vomiting    Level of Care/Admitting Diagnosis ED Disposition    ED Disposition Condition Comment   Admit  Hospital Area: [facility] [[id number]]  Level of Care: Med-Surg [16]  Covid Evaluation: Confirmed COVID Negative  Diagnosis: GI bleeding [098119]  Admitting Physician: Hannah Beat [[id number]]  Attending Physician: Hannah Beat [[id number]]  Estimated length of stay: past midnight tomorrow  Certification:: I certify this patient will need inpatient services for at least 2 midnights  PT Class (Do Not Modify): Inpatient [101]  PT Acc Code (Do Not Modify): Private [1]       B Medical/Surgery History Past Medical History:  Diagnosis Date  . Diabetes mellitus without complication (HCC)   . High cholesterol   . Hypertension    Past Surgical History:  Procedure Laterality Date  . CESAREAN SECTION    . CHOLECYSTECTOMY       A IV Location/Drains/Wounds Patient Lines/Drains/Airways Status   Active Line/Drains/Airways    None          Intake/Output Last  24 hours No intake or output data in the 24 hours ending 03/15/19 0409  Labs/Imaging Results for orders placed or performed during the hospital encounter of 03/14/19 (from the past 48 hour(s))  Comprehensive metabolic panel     Status: Abnormal   Collection Time: 03/14/19  4:22 PM  Result Value Ref Range   Sodium 136 135 - 145 mmol/L   Potassium 4.1 3.5 - 5.1 mmol/L   Chloride 95 (L) 98 - 111 mmol/L   CO2 28 22 - 32 mmol/L   Glucose, Bld 94 70 - 99 mg/dL   BUN 9 6 - 20 mg/dL   Creatinine, Ser 5.78 0.44 - 1.00 mg/dL   Calcium 46.9 8.9 - 62.9 mg/dL   Total Protein 7.7 6.5 - 8.1 g/dL   Albumin 4.5 3.5 - 5.0 g/dL   AST 26 15 - 41 U/L   ALT 29 0 - 44 U/L   Alkaline Phosphatase 77 38 - 126 U/L   Total Bilirubin 1.1 0.3 - 1.2 mg/dL   GFR calc non Af Amer >60 >60 mL/min   GFR calc Af Amer >60 >60 mL/min   Anion gap 13 5 - 15    Comment: Performed at [facility], [address] [place], [place] [address]  Lipase, blood     Status: None   Collection Time: 03/14/19  4:22 PM  Result Value Ref Range   Lipase 34 11 - 51 U/L    Comment: Performed at [facility], [address] [place], [place] [address]  CBC with  Differential     Status: Abnormal   Collection Time: 03/14/19  4:22 PM  Result Value Ref Range   WBC 15.4 (H) 4.0 - 10.5 K/uL   RBC 6.08 (H) 3.87 - 5.11 MIL/uL   Hemoglobin 15.7 (H) 12.0 - 15.0 g/dL   HCT 40.947.5 (H) 81.136.0 - 91.446.0 %   MCV 78.1 (L) 80.0 - 100.0 fL   MCH 25.8 (L) 26.0 - 34.0 pg   MCHC 33.1 30.0 - 36.0 g/dL   RDW 78.213.2 95.611.5 - 21.315.5 %   Platelets 298 150 - 400 K/uL   nRBC 0.0 0.0 - 0.2 %   Neutrophils Relative % 55 %   Neutro Abs 8.5 (H) 1.7 - 7.7 K/uL   Lymphocytes Relative 20 %   Lymphs Abs 3.1 0.7 - 4.0 K/uL   Monocytes Relative 6 %   Monocytes Absolute 1.0 0.1 - 1.0 K/uL   Eosinophils Relative 17 %   Eosinophils Absolute 2.7 (H) 0.0 - 0.5 K/uL   Basophils Relative 1 %   Basophils Absolute 0.1 0.0 - 0.1 K/uL   WBC Morphology MORPHOLOGY  UNREMARKABLE    RBC Morphology MORPHOLOGY UNREMARKABLE    Smear Review MORPHOLOGY UNREMARKABLE    Immature Granulocytes 1 %   Abs Immature Granulocytes 0.09 (H) 0.00 - 0.07 K/uL    Comment: Performed at [facility], [address] [place], [place] [address]  Pathologist smear review     Status: None   Collection Time: 03/14/19  4:22 PM  Result Value Ref Range   Path Review Blood smear reviewed due to increased eosinophils.     Comment: Platelets are adequate. Hemoglobin and RBC are high, with microcytosis. RBC morphology is otherwise unremarkable. There is leukocytosis with increased neutrophils and eosinophils. Basophils are not increased. Leukocytes show normal morphology. No blasts  are seen. There is significant eosinophilia without previous results to compare. The differential is broad and includes allergic/reactive process and also primary bone marrow processes. In addition, there is significant microcytic erythrocytosis, usually  associated with thalassemia or hemoglobin variant. In rare cases polycythemia vera can present with microcytic erythrocytosis. Further hematologic evaluation is indicated. Reviewed by Elnita MaxwellMary S. Forde Dandylney, MD. Performed at [facility], [address] [place], [place] [address]   Pregnancy, urine     Status: None   Collection Time: 03/14/19  7:13 PM  Result Value Ref Range   Preg Test, Ur NEGATIVE NEGATIVE    Comment: Performed at [facility], [address] [place], [place] [address]  Urinalysis, Complete w Microscopic     Status: Abnormal   Collection Time: 03/14/19  7:13 PM  Result Value Ref Range   Color, Urine YELLOW (A) YELLOW   APPearance HAZY (A) CLEAR   Specific Gravity, Urine 1.026 1.005 - 1.030   pH 5.0 5.0 - 8.0   Glucose, UA >=500 (A) NEGATIVE mg/dL   Hgb urine dipstick NEGATIVE NEGATIVE   Bilirubin Urine NEGATIVE NEGATIVE   Ketones, ur 20 (A) NEGATIVE mg/dL   Protein, ur NEGATIVE NEGATIVE mg/dL   Nitrite  NEGATIVE NEGATIVE   Leukocytes,Ua NEGATIVE NEGATIVE   RBC / HPF 0-5 0 - 5 RBC/hpf   WBC, UA 0-5 0 - 5 WBC/hpf   Bacteria, UA NONE SEEN NONE SEEN   Squamous Epithelial / LPF 0-5 0 - 5   Mucus PRESENT     Comment: Performed at [facility], [address] [place], [place] [address]  Pregnancy, urine POC     Status: None  Collection Time: 03/14/19  7:34 PM  Result Value Ref Range   Preg Test, Ur NEGATIVE NEGATIVE    Comment:        THE SENSITIVITY OF THIS METHODOLOGY IS >24 mIU/mL   SARS Coronavirus 2 (CEPHEID - Performed in [facility] hospital lab), Hosp Order     Status: None   Collection Time: 03/14/19  9:16 PM   Specimen: Nasopharyngeal Swab  Result Value Ref Range   SARS Coronavirus 2 NEGATIVE NEGATIVE    Comment: (NOTE) If result is NEGATIVE SARS-CoV-2 target nucleic acids are NOT DETECTED. The SARS-CoV-2 RNA is generally detectable in upper and lower  respiratory specimens during the acute phase of infection. The lowest  concentration of SARS-CoV-2 viral copies this assay can detect is 250  copies / mL. A negative result does not preclude SARS-CoV-2 infection  and should not be used as the sole basis for treatment or other  patient management decisions.  A negative result may occur with  improper specimen collection / handling, submission of specimen other  than nasopharyngeal swab, presence of viral mutation(s) within the  areas targeted by this assay, and inadequate number of viral copies  (<250 copies / mL). A negative result must be combined with clinical  observations, patient history, and epidemiological information. If result is POSITIVE SARS-CoV-2 target nucleic acids are DETECTED. The SARS-CoV-2 RNA is generally detectable in upper and lower  respiratory specimens dur ing the acute phase of infection.  Positive  results are indicative of active infection with SARS-CoV-2.  Clinical  correlation with patient history and other diagnostic information is   necessary to determine patient infection status.  Positive results do  not rule out bacterial infection or co-infection with other viruses. If result is PRESUMPTIVE POSTIVE SARS-CoV-2 nucleic acids MAY BE PRESENT.   A presumptive positive result was obtained on the submitted specimen  and confirmed on repeat testing.  While 2019 novel coronavirus  (SARS-CoV-2) nucleic acids may be present in the submitted sample  additional confirmatory testing may be necessary for epidemiological  and / or clinical management purposes  to differentiate between  SARS-CoV-2 and other Sarbecovirus currently known to infect humans.  If clinically indicated additional testing with an alternate test  methodology [phone]([id number]) is advised. The SARS-CoV-2 RNA is generally  detectable in upper and lower respiratory sp ecimens during the acute  phase of infection. The expected result is Negative. Fact Sheet for Patients:  BoilerBrush.com.cyhttps://www.fda.gov/media/136312/download Fact Sheet for Healthcare Providers: https://pope.com/https://www.fda.gov/media/136313/download This test is not yet approved or cleared by the [place] FDA and has been authorized for detection and/or diagnosis of SARS-CoV-2 by FDA under an Emergency Use Authorization (EUA).  This EUA will remain in effect (meaning this test can be used) for the duration of the COVID-19 declaration under Section 564(b)(1) of the Act, 21 U.S.C. section 360bbb-3(b)(1), unless the authorization is terminated or revoked sooner. Performed at [facility], [address] [place], [place] [address]   C difficile quick scan w PCR reflex     Status: None   Collection Time: 03/14/19 11:06 PM   Specimen: Stool  Result Value Ref Range   C Diff antigen NEGATIVE NEGATIVE   C Diff toxin NEGATIVE NEGATIVE   C Diff interpretation No C. difficile detected.     Comment: Performed at [facility], [address] [place], [place] [address]   [place] Abdomen Complete  Result  Date: 03/14/2019 CLINICAL DATA:  Upper abdominal pain. Elevated white blood cell count. EXAM: ABDOMEN  ULTRASOUND COMPLETE COMPARISON:  CT 5 days ago FINDINGS: Gallbladder: Surgically absent. Common bile duct: Diameter: 6 mm, normal. Liver: No focal lesion identified. Mild diffusely increased in parenchymal echogenicity. Portal vein is patent on color Doppler imaging with normal direction of blood flow towards the liver. IVC: No abnormality visualized. Pancreas: Visualized portion unremarkable. Spleen: Size and appearance within normal limits. Right Kidney: Length: 11.4 cm. Echogenicity within normal limits. Nonobstructing 7 mm stone in the mid kidney. No mass or hydronephrosis visualized. Left Kidney: Length: 11.1 cm. Echogenicity within normal limits. No mass or hydronephrosis visualized. Abdominal aorta: No aneurysm visualized. Other findings: None.  No ascites. IMPRESSION: 1. No acute findings or explanation for abdominal pain. 2. Nonobstructing right renal stone. 3. Mild hepatic steatosis. Electronically Signed   By: Narda RutherfordMelanie  Sanford M.D.   On: 03/14/2019 19:50    Pending Labs Unresulted Labs (From admission, onward)    Start     Ordered   03/15/19 0500  Comprehensive metabolic panel  Tomorrow morning,   STAT     03/15/19 0333   03/15/19 0500  CBC  Tomorrow morning,   STAT     03/15/19 0333   03/15/19 0500  Protime-INR  Tomorrow morning,   STAT     03/15/19 0333   03/15/19 0342  Lactate dehydrogenase  Once,   STAT     03/15/19 0341   03/15/19 0342  Haptoglobin  Once,   STAT     03/15/19 0341   03/15/19 0337  Reticulocytes  Once,   STAT     03/15/19 0341   03/15/19 0336  Iron and TIBC  Once,   STAT     03/15/19 0341   03/15/19 0336  Ferritin  Once,   STAT     03/15/19 0341   03/15/19 0336  Vitamin B12  Once,   STAT     03/15/19 0341   03/15/19 0336  Folate  Once,   STAT     03/15/19 0341   03/15/19 0333  Hemoglobin and hematocrit, blood  Now then every 6 hours,   STAT     03/15/19 0333    03/15/19 0329  C difficile quick scan w PCR reflex  (C Difficile quick screen w PCR reflex panel)  Once, for 24 hours,   STAT     03/15/19 0333   03/15/19 0328  Stool culture  ONCE - STAT,   STAT     03/15/19 0333   03/15/19 0328  OVA + PARASITE EXAM  Once,   STAT     03/15/19 0333   03/15/19 0322  HIV antibody (Routine Testing)  Once,   STAT     03/15/19 0333   03/14/19 2009  Gastrointestinal Panel by PCR , Stool  (Gastrointestinal Panel by PCR, Stool)  ONCE - STAT,   STAT     03/14/19 2010          Vitals/Pain Today's Vitals   03/15/19 0130 03/15/19 0145 03/15/19 0200 03/15/19 0215  BP: 110/64  (!) 97/55   Pulse: 74 77 65 64  Resp:      Temp:      TempSrc:      SpO2: 92% 97% 95% 96%  Weight:      Height:      PainSc:        Isolation Precautions Enteric precautions (UV disinfection)  Medications Medications  HYDROcodone-acetaminophen (NORCO/VICODIN) 5-325 MG per tablet 1-2 tablet (has no administration in time range)  lisinopril (ZESTRIL) tablet 20 mg (  has no administration in time range)  insulin aspart (novoLOG) injection 0-9 Units (has no administration in time range)  vitamin B-12 (CYANOCOBALAMIN) tablet 1,000 mcg (has no administration in time range)  montelukast (SINGULAIR) tablet 10 mg (has no administration in time range)  0.9 %  sodium chloride infusion (has no administration in time range)  acetaminophen (TYLENOL) tablet 650 mg (has no administration in time range)    Or  acetaminophen (TYLENOL) suppository 650 mg (has no administration in time range)  traZODone (DESYREL) tablet 25 mg (has no administration in time range)  magnesium hydroxide (MILK OF MAGNESIA) suspension 30 mL (has no administration in time range)  ondansetron (ZOFRAN) tablet 4 mg (has no administration in time range)    Or  ondansetron (ZOFRAN) injection 4 mg (has no administration in time range)  pantoprazole (PROTONIX) 80 mg in sodium chloride 0.9 % 250 mL (0.32 mg/mL) infusion (has  no administration in time range)  pantoprazole (PROTONIX) injection 40 mg (has no administration in time range)  pantoprazole (PROTONIX) 80 mg in sodium chloride 0.9 % 100 mL IVPB (has no administration in time range)  acetaminophen (TYLENOL) tablet 1,000 mg (1,000 mg Oral Given 03/14/19 2045)    Mobility walks Low fall risk   Focused Assessments Gastrointestinal   R Recommendations: See Admitting Provider Note  Report given to:   Additional Notes:

## 2019-03-15 NOTE — Consult Note (Addendum)
Melodie BouillonVarnita Samanthan Dugo, MD [address], [place], [place], [address] [address], [place], [place], [address] Phone: [phone]  Fax: [phone]  Consultation  Referring Provider:     Dr. Elisabeth PigeonVachhani Primary Care Physician:  System, Pcp Not In Reason for Consultation:     Diarrhea, abdominal pain  Date of Admission:  03/14/2019 Date of Consultation:  03/15/2019         HPI:   Andrea Shields is a 48 y.o. female with 1 week history of abdominal pain, nausea vomiting and diarrhea.  Patient states symptoms came on all of a sudden.  Denies eating outside, recent travel, or any sick contacts.  Denies any new medications.  Denies any NSAID use.  Denies any hematemesis.  Reports vomiting and nausea resolved after 2 days and she was back to normal for 1 to 2 days and then symptoms restarted.  She is reporting 10-20 loose bowel movements a day over this time period.  Infectious stool work-up was negative.  She also reports black stools during this time.  Denies any dysphagia.  Denies any red blood in stool.  As of this morning her abdominal pain has resolved unless you palpate the area.  It is in the midepigastric region, 5/10, cramping, dull, nonradiating.  She has only had one loose bowel movement today compared to 10 yesterday.  No previous history of similar symptoms No prior EGD or colonoscopy.  Past Medical History:  Diagnosis Date   Diabetes mellitus without complication (HCC)    High cholesterol    Hypertension     Past Surgical History:  Procedure Laterality Date   CESAREAN SECTION     CHOLECYSTECTOMY      Prior to Admission medications   Medication Sig Start Date End Date Taking? Authorizing Provider  acidophilus (RISAQUAD) CAPS capsule Take 1 capsule by mouth daily.   Yes [provider]  amoxicillin-clavulanate (AUGMENTIN) 875-125 MG tablet Take 1 tablet by mouth 2 (two) times daily for 10 days. 03/09/19 03/19/19 Yes Shaune PollackIsaacs, Cameron, MD    empagliflozin (JARDIANCE) 25 MG TABS tablet Take 25 mg by mouth daily.   Yes [provider]  HYDROcodone-acetaminophen (NORCO/VICODIN) 5-325 MG tablet Take 1-2 tablets by mouth every 6 (six) hours as needed for moderate pain or severe pain. 03/09/19 03/08/20 Yes Shaune PollackIsaacs, Cameron, MD  lisinopril (ZESTRIL) 20 MG tablet Take 20 mg by mouth daily. 07/30/18  Yes [provider]  metFORMIN (GLUCOPHAGE) 1000 MG tablet Take 500 mg by mouth 2 (two) times a day.   Yes [provider]  montelukast (SINGULAIR) 10 MG tablet Take 10 mg by mouth daily. 10/06/18  Yes [provider]  pantoprazole (PROTONIX) 20 MG tablet Take 1 tablet (20 mg total) by mouth 2 (two) times daily for 10 days. 03/09/19 03/19/19 Yes Shaune PollackIsaacs, Cameron, MD  Semaglutide, 1 MG/DOSE, (OZEMPIC, 1 MG/DOSE,) 2 MG/1.5ML SOPN Inject 1 mg into the skin every 7 (seven) days.   Yes [provider]  vitamin B-12 (CYANOCOBALAMIN) 1000 MCG tablet Take 1,000 mcg by mouth daily.   Yes [provider]  ondansetron (ZOFRAN ODT) 4 MG disintegrating tablet Take 1 tablet (4 mg total) by mouth every 8 (eight) hours as needed for nausea or vomiting. 03/09/19   Shaune PollackIsaacs, Cameron, MD    No family history on file.   Social History   Tobacco Use   Smoking status: Never Smoker   Smokeless tobacco: Never Used  Substance Use Topics   Alcohol use: Not on  file   Drug use: Not on file    Allergies as of 03/14/2019 - Review Complete 03/14/2019  Allergen Reaction Noted   Iodinated diagnostic agents Anaphylaxis 03/09/2019   Shellfish allergy  03/09/2019   Sitagliptin Nausea And Vomiting 06/16/2014    Review of Systems:    All systems reviewed and negative except where noted in HPI.   Physical Exam:  Vital signs in last 24 hours: Vitals:   03/15/19 0330 03/15/19 0400 03/15/19 0434 03/15/19 1007  BP: 108/78 112/82 128/74 104/70  Pulse: 68 65 65 83  Resp:   20 17  Temp:   98.1 F (36.7 C) (!) 97.1 F  (36.2 C)  TempSrc:   Oral Temporal  SpO2: 93% 99% 99% 96%  Weight:   79.7 kg 79.4 kg  Height:   5\' 3"  (1.6 m) 5\' 3"  (1.6 m)   Last BM Date: 03/15/19 General:   Pleasant, cooperative in NAD Head:  Normocephalic and atraumatic. Eyes:   No icterus.   Conjunctiva pink. PERRLA. Ears:  Normal auditory acuity. Neck:  Supple; no masses or thyroidomegaly Lungs: Respirations even and unlabored. Lungs clear to auscultation bilaterally.   No wheezes, crackles, or rhonchi.  Abdomen:  Soft, nondistended, nontender. Normal bowel sounds. No appreciable masses or hepatomegaly.  No rebound or guarding.  Neurologic:  Alert and oriented x3;  grossly normal neurologically. Skin:  Intact without significant lesions or rashes. Cervical Nodes:  No significant cervical adenopathy. Psych:  Alert and cooperative. Normal affect.  LAB RESULTS: Recent Labs    03/13/19 1754 03/14/19 1622 03/15/19 0517  WBC 17.7* 15.4* 14.7*  HGB 16.2* 15.7* 14.0   14.1  HCT 48.7* 47.5* 42.1   42.5  PLT 293 298 260   BMET Recent Labs    03/13/19 1754 03/14/19 1622 03/15/19 0517  NA 136 136 136  K 3.8 4.1 3.9  CL 98 95* 100  CO2 23 28 28   GLUCOSE 121* 94 120*  BUN 8 9 10   CREATININE 0.58 0.65 0.64  CALCIUM 9.9 10.0 9.1   LFT Recent Labs    03/15/19 0517  PROT 6.7  ALBUMIN 3.9  AST 23  ALT 27  ALKPHOS 66  BILITOT 0.6   PT/INR Recent Labs    03/15/19 0517  LABPROT 13.0  INR 1.0    STUDIES: [place] Abdomen Complete  Result Date: 03/14/2019 CLINICAL DATA:  Upper abdominal pain. Elevated white blood cell count. EXAM: ABDOMEN ULTRASOUND COMPLETE COMPARISON:  CT 5 days ago FINDINGS: Gallbladder: Surgically absent. Common bile duct: Diameter: 6 mm, normal. Liver: No focal lesion identified. Mild diffusely increased in parenchymal echogenicity. Portal vein is patent on color Doppler imaging with normal direction of blood flow towards the liver. IVC: No abnormality visualized. Pancreas: Visualized portion  unremarkable. Spleen: Size and appearance within normal limits. Right Kidney: Length: 11.4 cm. Echogenicity within normal limits. Nonobstructing 7 mm stone in the mid kidney. No mass or hydronephrosis visualized. Left Kidney: Length: 11.1 cm. Echogenicity within normal limits. No mass or hydronephrosis visualized. Abdominal aorta: No aneurysm visualized. Other findings: None.  No ascites. IMPRESSION: 1. No acute findings or explanation for abdominal pain. 2. Nonobstructing right renal stone. 3. Mild hepatic steatosis. Electronically Signed   By: Narda RutherfordMelanie  Sanford M.D.   On: 03/14/2019 19:50      Impression / Plan:   Andrea PerkingCandi Duprey is a 48 y.o. y/o female with abdominal pain and diarrhea for 1 week  Patient's main complaint is the abdominal pain that she has been  having intermittently over the last week  Diarrhea is improved today but recent infectious work-up was negative Since she does have history of diabetes, and reports her last A1c to be 6.5.  States it was 11 a year ago and her medications were changed.  She also has mild leukocytosis without any fever or signs of infection  We will proceed with EGD today to evaluate source of nausea vomiting and abdominal pain and obtain biopsies if needed  However, if EGD does not explain her symptoms alone, may choose to do an unprepped colonoscopy today as well given her diarrhea  Patient also has microcytosis and reports recent stool test being positive for guaiac.  Therefore colonoscopy indicated if EGD is normal.  Would recommend hematology consult due to leukocytosis  I have discussed alternative options, risks & benefits,  which include, but are not limited to, bleeding, infection, perforation,respiratory complication & drug reaction.  The patient agrees with this plan & written consent will be obtained.     Thank you for involving me in the care of this patient.      LOS: 0 days   Virgel Manifold, MD  03/15/2019, 11:21 AM

## 2019-03-15 NOTE — Discharge Summary (Signed)
[facility] NAME: Andrea Shields    MR#:  [id number]  DATE OF BIRTH:  [birth year]  DATE OF ADMISSION:  03/14/2019 ADMITTING PHYSICIAN: Christel Mormon, MD  DATE OF DISCHARGE: 03/15/2019   PRIMARY CARE PHYSICIAN: System, Pcp Not In    ADMISSION DIAGNOSIS:  Pain of upper abdomen [R10.10] Leukocytosis, unspecified type [D72.829] Diarrhea, unspecified type [R19.7]  DISCHARGE DIAGNOSIS:  Active Problems:   GI bleeding   SECONDARY DIAGNOSIS:   Past Medical History:  Diagnosis Date  . Diabetes mellitus without complication ([place])   . High cholesterol   . Hypertension     HOSPITAL COURSE:   Andrea Shields  is a 48 y.o. Caucasian female with a known history of diabetes mellitus, hypertension dyslipidemia, who presented to the emergency room with acute onset of epigastric abdominal pain which has been going on for the last week as well as melanotic stools.  She admitted to nausea and vomiting without bilious vomitus or hematemesis.  She has been having recurrent diarrhea over the last week with loose and watery bowel movements.  No dyspnea or cough or wheezing or hemoptysis.  No other bleeding diathesis.  She denied any recent sick exposures.  No dysuria, oliguria or hematuria or flank pain.  She denied any fever or chills.  Upon presentation to the emergency room, blood pressure was 120/86 and dropped briefly to 98/56 with otherwise normal vital signs.  Labs revealed H&H of 15.7 and 47.5 compared with hemoglobin of 16.2 and hematocrit 48.7 yesterday today down from hemoglobin of 17.3 and hematocrit of 52.9 on 03/09/2019.  WBCs were 15.4 with neutrophilia compared to 17.7 yesterday and 19.1 on 7/12 with platelets of 298.  CMP was remarkable for mild hypo-chloremia.  Urinalysis showed more than 500 glucose and 20 ketones.  Urine pregnancy test came back negative.  COVID-19 test came back negative.  The patient had the ER visit and an  abdominal CT scan on 03/09/2019 that showed absent gallbladder, nonobstructing calculus measuring 6X 4 mm in the mid right kidney with no hydronephrosis or ureteral calculus on either side.  There was no bowel obstruction.  Appendix was normal.  She had a retroverted uterus with IUD positioned within the endometrium.  The patient was given 1 g of p.o. Tylenol here in the ER.  Dr. Bonna Gains was consulted over the phone and will be seeing the patient.  She will be admitted a medical monitored bed for further evaluation and management.  Her hemoglobin remained stable, upper and lower GI endoscopy was done by GI, showed poorly prepared bowels for colonoscopy but no active bleed and on endoscopy showed some erosions in duodenum and antrum of the stomach and biopsies were taken.  No active major bleed. Patient was started on diet and tolerated very well.  GI suggested to discharge home and follow-up in 2 to 3 weeks in GI clinic.  We gave PPIs at the time of discharge.  DISCHARGE CONDITIONS:   Stable.  CONSULTS OBTAINED:  Treatment Team:  Virgel Manifold, MD  DRUG ALLERGIES:   Allergies  Allergen Reactions  . Iodinated Diagnostic Agents Anaphylaxis  . Shellfish Allergy   . Sitagliptin Nausea And Vomiting    DISCHARGE MEDICATIONS:   Allergies as of 03/15/2019      Reactions   Iodinated Diagnostic Agents Anaphylaxis   Shellfish Allergy    Sitagliptin Nausea And Vomiting      Medication List    STOP taking these  medications   amoxicillin-clavulanate 875-125 MG tablet Commonly known as: Augmentin     TAKE these medications   acidophilus Caps capsule Take 1 capsule by mouth daily.   HYDROcodone-acetaminophen 5-325 MG tablet Commonly known as: NORCO/VICODIN Take 1-2 tablets by mouth every 6 (six) hours as needed for moderate pain or severe pain.   Jardiance 25 MG Tabs tablet Generic drug: empagliflozin Take 25 mg by mouth daily.   lisinopril 20 MG tablet Commonly known  as: ZESTRIL Take 20 mg by mouth daily.   metFORMIN 1000 MG tablet Commonly known as: GLUCOPHAGE Take 500 mg by mouth 2 (two) times a day.   montelukast 10 MG tablet Commonly known as: SINGULAIR Take 10 mg by mouth daily.   ondansetron 4 MG disintegrating tablet Commonly known as: Zofran ODT Take 1 tablet (4 mg total) by mouth every 8 (eight) hours as needed for nausea or vomiting.   Ozempic (1 MG/DOSE) 2 MG/1.5ML Sopn Generic drug: Semaglutide (1 MG/DOSE) Inject 1 mg into the skin every 7 (seven) days.   pantoprazole 40 MG tablet Commonly known as: Protonix Take 1 tablet (40 mg total) by mouth 2 (two) times daily for 10 days. What changed:   medication strength  how much to take   vitamin B-12 1000 MCG tablet Commonly known as: CYANOCOBALAMIN Take 1,000 mcg by mouth daily.        DISCHARGE INSTRUCTIONS:    Follow in GI clinic in 2-3 weeks.  If you experience worsening of your admission symptoms, develop shortness of breath, life threatening emergency, suicidal or homicidal thoughts you must seek medical attention immediately by calling 911 or calling your MD immediately  if symptoms less severe.  You Must read complete instructions/literature along with all the possible adverse reactions/side effects for all the Medicines you take and that have been prescribed to you. Take any new Medicines after you have completely understood and accept all the possible adverse reactions/side effects.   Please note  You were cared for by a hospitalist during your hospital stay. If you have any questions about your discharge medications or the care you received while you were in the hospital after you are discharged, you can call the unit and asked to speak with the hospitalist on call if the hospitalist that took care of you is not available. Once you are discharged, your primary care physician will handle any further medical issues. Please note that NO REFILLS for any discharge  medications will be authorized once you are discharged, as it is imperative that you return to your primary care physician (or establish a relationship with a primary care physician if you do not have one) for your aftercare needs so that they can reassess your need for medications and monitor your lab values.    Today   CHIEF COMPLAINT:   Chief Complaint  Patient presents with  . Abdominal Pain    HISTORY OF PRESENT ILLNESS:  Andrea Shields  is a 48 y.o. female with a known history of diabetes mellitus, hypertension dyslipidemia, who presented to the emergency room with acute onset of epigastric abdominal pain which has been going on for the last week as well as melanotic stools.  She admitted to nausea and vomiting without bilious vomitus or hematemesis.  She has been having recurrent diarrhea over the last week with loose and watery bowel movements.  No dyspnea or cough or wheezing or hemoptysis.  No other bleeding diathesis.  She denied any recent sick exposures.  No dysuria, oliguria  or hematuria or flank pain.  She denied any fever or chills.  Upon presentation to the emergency room, blood pressure was 120/86 and dropped briefly to 98/56 with otherwise normal vital signs.  Labs revealed H&H of 15.7 and 47.5 compared with hemoglobin of 16.2 and hematocrit 48.7 yesterday today down from hemoglobin of 17.3 and hematocrit of 52.9 on 03/09/2019.  WBCs were 15.4 with neutrophilia compared to 17.7 yesterday and 19.1 on 7/12 with platelets of 298.  CMP was remarkable for mild hypo-chloremia.  Urinalysis showed more than 500 glucose and 20 ketones.  Urine pregnancy test came back negative.  COVID-19 test came back negative.  The patient had the ER visit and an abdominal CT scan on 03/09/2019 that showed absent gallbladder, nonobstructing calculus measuring 6X 4 mm in the mid right kidney with no hydronephrosis or ureteral calculus on either side.  There was no bowel obstruction.  Appendix was normal.   She had a retroverted uterus with IUD positioned within the endometrium.  The patient was given 1 g of p.o. Tylenol here in the ER.  Dr. Maximino Greenlandahiliani was consulted over the phone and will be seeing the patient.  She will be admitted a medical monitored bed for further evaluation and management.   VITAL SIGNS:  Blood pressure 122/65, pulse 62, temperature 97.8 F (36.6 C), temperature source Oral, resp. rate 14, height 5\' 3"  (1.6 m), weight 79.4 kg, SpO2 100 %.  I/O:    Intake/Output Summary (Last 24 hours) at 03/15/2019 1615 Last data filed at 03/15/2019 1230 Gross per 24 hour  Intake 977.17 ml  Output 0 ml  Net 977.17 ml    PHYSICAL EXAMINATION:  GENERAL:  48 y.o.-year-old patient lying in the bed with no acute distress.  EYES: Pupils equal, round, reactive to light and accommodation. No scleral icterus. Extraocular muscles intact.  HEENT: Head atraumatic, normocephalic. Oropharynx and nasopharynx clear.  NECK:  Supple, no jugular venous distention. No thyroid enlargement, no tenderness.  LUNGS: Normal breath sounds bilaterally, no wheezing, rales,rhonchi or crepitation. No use of accessory muscles of respiration.  CARDIOVASCULAR: S1, S2 normal. No murmurs, rubs, or gallops.  ABDOMEN: Soft, non-tender, non-distended. Bowel sounds present. No organomegaly or mass.  EXTREMITIES: No pedal edema, cyanosis, or clubbing.  NEUROLOGIC: Cranial nerves II through XII are intact. Muscle strength 5/5 in all extremities. Sensation intact. Gait not checked.  PSYCHIATRIC: The patient is alert and oriented x 3.  SKIN: No obvious rash, lesion, or ulcer.   DATA REVIEW:   CBC Recent Labs  Lab 03/15/19 0517 03/15/19 1522  WBC 14.7*  --   HGB 14.0  14.1 12.9  HCT 42.1  42.5 39.1  PLT 260  --     Chemistries  Recent Labs  Lab 03/15/19 0517  NA 136  K 3.9  CL 100  CO2 28  GLUCOSE 120*  BUN 10  CREATININE 0.64  CALCIUM 9.1  AST 23  ALT 27  ALKPHOS 66  BILITOT 0.6    Cardiac  Enzymes No results for input(s): TROPONINI in the last 168 hours.  Microbiology Results  Results for orders placed or performed during the hospital encounter of 03/14/19  SARS Coronavirus 2 (CEPHEID - Performed in [facility] lab), Hosp Order     Status: None   Collection Time: 03/14/19  9:16 PM   Specimen: Nasopharyngeal Swab  Result Value Ref Range Status   SARS Coronavirus 2 NEGATIVE NEGATIVE Final    Comment: (NOTE) If result is NEGATIVE SARS-CoV-2 target nucleic  acids are NOT DETECTED. The SARS-CoV-2 RNA is generally detectable in upper and lower  respiratory specimens during the acute phase of infection. The lowest  concentration of SARS-CoV-2 viral copies this assay can detect is 250  copies / mL. A negative result does not preclude SARS-CoV-2 infection  and should not be used as the sole basis for treatment or other  patient management decisions.  A negative result may occur with  improper specimen collection / handling, submission of specimen other  than nasopharyngeal swab, presence of viral mutation(s) within the  areas targeted by this assay, and inadequate number of viral copies  (<250 copies / mL). A negative result must be combined with clinical  observations, patient history, and epidemiological information. If result is POSITIVE SARS-CoV-2 target nucleic acids are DETECTED. The SARS-CoV-2 RNA is generally detectable in upper and lower  respiratory specimens dur ing the acute phase of infection.  Positive  results are indicative of active infection with SARS-CoV-2.  Clinical  correlation with patient history and other diagnostic information is  necessary to determine patient infection status.  Positive results do  not rule out bacterial infection or co-infection with other viruses. If result is PRESUMPTIVE POSTIVE SARS-CoV-2 nucleic acids MAY BE PRESENT.   A presumptive positive result was obtained on the submitted specimen  and confirmed on repeat  testing.  While 2019 novel coronavirus  (SARS-CoV-2) nucleic acids may be present in the submitted sample  additional confirmatory testing may be necessary for epidemiological  and / or clinical management purposes  to differentiate between  SARS-CoV-2 and other Sarbecovirus currently known to infect humans.  If clinically indicated additional testing with an alternate test  methodology [phone]([id number]) is advised. The SARS-CoV-2 RNA is generally  detectable in upper and lower respiratory sp ecimens during the acute  phase of infection. The expected result is Negative. Fact Sheet for Patients:  BoilerBrush.com.cyhttps://www.fda.gov/media/136312/download Fact Sheet for Healthcare Providers: https://pope.com/https://www.fda.gov/media/136313/download This test is not yet approved or cleared by the [place] FDA and has been authorized for detection and/or diagnosis of SARS-CoV-2 by FDA under an Emergency Use Authorization (EUA).  This EUA will remain in effect (meaning this test can be used) for the duration of the COVID-19 declaration under Section 564(b)(1) of the Act, 21 U.S.C. section 360bbb-3(b)(1), unless the authorization is terminated or revoked sooner. Performed at [facility], [address] [place], [place] [address]   Gastrointestinal Panel by PCR , Stool     Status: None   Collection Time: 03/14/19 11:06 PM   Specimen: Stool  Result Value Ref Range Status   Campylobacter species NOT DETECTED NOT DETECTED Final   Plesimonas shigelloides NOT DETECTED NOT DETECTED Final   Salmonella species NOT DETECTED NOT DETECTED Final   Yersinia enterocolitica NOT DETECTED NOT DETECTED Final   Vibrio species NOT DETECTED NOT DETECTED Final   Vibrio cholerae NOT DETECTED NOT DETECTED Final   Enteroaggregative E coli (EAEC) NOT DETECTED NOT DETECTED Final   Enteropathogenic E coli (EPEC) NOT DETECTED NOT DETECTED Final   Enterotoxigenic E coli (ETEC) NOT DETECTED NOT DETECTED Final   Shiga like toxin producing E  coli (STEC) NOT DETECTED NOT DETECTED Final   Shigella/Enteroinvasive E coli (EIEC) NOT DETECTED NOT DETECTED Final   Cryptosporidium NOT DETECTED NOT DETECTED Final   Cyclospora cayetanensis NOT DETECTED NOT DETECTED Final   Entamoeba histolytica NOT DETECTED NOT DETECTED Final   Giardia lamblia NOT DETECTED NOT DETECTED Final   Adenovirus F40/41 NOT DETECTED NOT DETECTED Final  Astrovirus NOT DETECTED NOT DETECTED Final   Norovirus GI/GII NOT DETECTED NOT DETECTED Final   Rotavirus A NOT DETECTED NOT DETECTED Final   Sapovirus (I, II, IV, and V) NOT DETECTED NOT DETECTED Final    Comment: Performed at [facility], [address] [place], [place] [address]  C difficile quick scan w PCR reflex     Status: None   Collection Time: 03/14/19 11:06 PM   Specimen: Stool  Result Value Ref Range Status   C Diff antigen NEGATIVE NEGATIVE Final   C Diff toxin NEGATIVE NEGATIVE Final   C Diff interpretation No C. difficile detected.  Final    Comment: Performed at [facility], [address] [place] [place], [place] [address]    RADIOLOGY:  US Abdomen Complete  Result Date: 03/14/2019 CLINICAL DATA:  Upper abdominal pain. Elevated white blood cell count. EXAM: ABDOMEN ULTRASOUND COMPLETE COMPARISON:  CT 5 days ago FINDINGS: Gallbladder: Surgically absent. Common bile duct: Diameter: 6 mm, normal. Liver: No focal lesion identified. Mild diffusely increased in parenchymal echogenicity. Portal vein is patent on color Doppler imaging with normal direction of blood flow towards the liver. IVC: No abnormality visualized. Pancreas: Visualized portion unremarkable. Spleen: Size and appearance within normal limits. Right Kidney: Length: 11.4 cm. Echogenicity within normal limits. Nonobstructing 7 mm stone in the mid kidney. No mass or hydronephrosis visualized. Left Kidney: Length: 11.1 cm. Echogenicity within normal limits. No mass or hydronephrosis visualized. Abdominal aorta: No aneurysm  visualized. Other findings: None.  No ascites. IMPRESSION: 1. No acute findings or explanation for abdominal pain. 2. Nonobstructing right renal stone. 3. Mild hepatic steatosis. Electronically Signed   By: Narda Rutherford M.D.   On: 03/14/2019 19:50    EKG:   Orders placed or performed during the hospital encounter of 03/13/19  . ED EKG  . ED EKG  . EKG 12-Lead  . EKG 12-Lead  . EKG      Management plans discussed with the patient, family and they are in agreement.  CODE STATUS:     Code Status Orders  (From admission, onward)         Start     Ordered   03/15/19 0324  Full code  Continuous     03/15/19 0333        Code Status History    This patient has a current code status but no historical code status.   Advance Care Planning Activity      TOTAL TIME TAKING CARE OF THIS PATIENT: 34 minutes.    Altamese Dilling M.D on 03/15/2019 at 4:15 PM  Between 7am to 6pm - Pager - [phone]  After 6pm go to [email or web address] - password [email or web address]  [facility]  Office  [phone]  CC: Primary care physician; System, Pcp Not In   Note: This dictation was prepared with Dragon dictation along with smaller phrase technology. Any transcriptional errors that result from this process are unintentional.

## 2019-03-15 NOTE — Anesthesia Postprocedure Evaluation (Signed)
Anesthesia Post Note  Patient: Andrea Shields  Procedure(s) Performed: ESOPHAGOGASTRODUODENOSCOPY (EGD) (N/A ) COLONOSCOPY WITH PROPOFOL (N/A )  Patient location during evaluation: PACU Anesthesia Type: MAC Level of consciousness: awake and alert Pain management: pain level controlled Vital Signs Assessment: post-procedure vital signs reviewed and stable Respiratory status: spontaneous breathing, nonlabored ventilation and respiratory function stable Cardiovascular status: stable and blood pressure returned to baseline Postop Assessment: no apparent nausea or vomiting Anesthetic complications: no     Last Vitals:  Vitals:   03/15/19 1310 03/15/19 1316  BP: 110/64 111/64  Pulse: 79 71  Resp: 14 14  Temp:    SpO2: 98% 99%    Last Pain:  Vitals:   03/15/19 1316  TempSrc:   PainSc: 0-No pain                 Alphonsus Sias

## 2019-03-15 NOTE — ED Notes (Signed)
Report given to Jenna, RN

## 2019-03-15 NOTE — Op Note (Signed)
[facility]astroenterology Patient Name: Andrea Shields Procedure Date: 03/15/2019 2:01 PM MRN: [id number] Account #: [id number] Date of Birth: Oct 10, 1970 Admit Type: Inpatient Age: 48 Room: [facility] ENDO ROOM 4 Gender: Female Note Status: Finalized Procedure:            Colonoscopy Indications:          Abdominal pain, Clinically significant diarrhea of                        unexplained origin Providers:            Vonda Antigua MD, MD Medicines:            Monitored Anesthesia Care Complications:        No immediate complications. Procedure:            Pre-Anesthesia Assessment:                       - Prophylactic Antibiotics: The patient does not                        require prophylactic antibiotics.                       - After reviewing the risks and benefits, the patient                        was deemed in satisfactory condition to undergo the                        procedure.                       - Monitored anesthesia care was determined to be                        medically necessary for this procedure based on review                        of the patient's medical history, medications, and                        prior anesthesia history.                       - The anesthesia plan was to use monitored anesthesia                        care (MAC).                       - Prior to the procedure, a History and Physical was                        performed, and patient medications, allergies and                        sensitivities were reviewed. The patient's tolerance of                        previous anesthesia was reviewed.                       -  The risks and benefits of the procedure and the                        sedation options and risks were discussed with the                        patient. All questions were answered and informed                        consent was obtained.                       - Patient identification and proposed  procedure were                        verified prior to the procedure by the physician, the                        nurse, the anesthesiologist, the anesthetist and the                        technician. The procedure was verified in the procedure                        room.                       - ASA Grade Assessment: II - A patient with mild                        systemic disease.                       After obtaining informed consent, the colonoscope was                        passed under direct vision. Throughout the procedure,                        the patient's blood pressure, pulse, and oxygen                        saturations were monitored continuously. The                        colonoscopy was performed with ease. The patient                        tolerated the procedure well. The quality of the bowel                        preparation was poor. The Colonoscope was introduced                        through the anus and advanced to the the cecum,                        identified by appendiceal orifice and ileocecal valve. Findings:      The perianal and digital rectal examinations were normal.      Copious quantities of semi-solid solid stool was found in the  entire       colon, interfering with visualization.      Normal mucosa was found in the entire colon. Biopsies for histology were       taken with a cold forceps from the cecum, ascending colon, transverse       colon, descending colon, sigmoid colon and rectum for evaluation of       microscopic colitis.      The retroflexed view of the distal rectum and anal verge was normal and       showed no anal or rectal abnormalities.      Images were captured and need to be transferred by the endo staff at a       later time. Impression:           - Preparation of the colon was poor.                       - Stool in the entire examined colon.                       - Normal mucosa in the entire examined colon. Biopsied.                        - The distal rectum and anal verge are normal on                        retroflexion view.                       - Due to the prep, this was not a satisfactory exam for                        colorectal cancer screening, that is evaluation for                        small or flat lesions or polyps. Patient should follow                        up in clinic after discharge to discuss need for future                        colonoscopy and colorectal cancer screening. Recommendation:       - Resume regular diet.                       - Await pathology results.                       - Return to my office in 4 weeks.                       - The findings and recommendations were discussed with                        the patient.                       - Continue present medications. Procedure Code(s):    --- Professional ---                       [phone], Colonoscopy, flexible;  with biopsy, single or                        multiple Diagnosis Code(s):    --- Professional ---                       R10.9, Unspecified abdominal pain                       R19.7, Diarrhea, unspecified CPT copyright 2019 American Medical Association. All rights reserved. The codes documented in this report are preliminary and upon coder review may  be revised to meet current compliance requirements.  Vonda Antigua, MD Margretta Sidle B. Bonna Gains MD, MD 03/15/2019 2:09:11 PM This report has been signed electronically. Number of Addenda: 0 Note Initiated On: 03/15/2019 2:01 PM Estimated Blood Loss: Estimated blood loss: none.      [facility]

## 2019-03-15 NOTE — Progress Notes (Signed)
Official colonoscopy report will be available at a later time  Images were captured for the colonoscopy, but need to be transferred and provation and the report dictated report will be available in epic.  In brief, colonoscopy showed stool throughout the colon.  Poor prep.  Extent of exam cecum.  The colon mucosa itself appeared normal.  Biopsies were obtained throughout the colon, and the cecum, ascending colon, transverse colon, descending colon, sigmoid and rectum to rule out microscopic colitis and eosinophilic colitis due to patient's diarrhea.  Due to the poor prep, this was not an appropriate exam for screening. Plan patient will need a screening colonoscopy as an outpatient in the next 3 to 6 months.  Patient to follow-up in GI clinic with her primary gastroenterologist or our clinic, whichever she chooses within 4 weeks of discharge.   IV Protonix has been discontinued  Protonix 20 mg oral once daily due to abdominal pain  Await pathology results

## 2019-03-17 ENCOUNTER — Encounter: Payer: Self-pay | Admitting: Gastroenterology

## 2019-03-18 LAB — HIV ANTIBODY (ROUTINE TESTING W REFLEX): HIV Screen 4th Generation wRfx: NONREACTIVE

## 2019-03-18 LAB — HAPTOGLOBIN: Haptoglobin: 245 mg/dL (ref 42–296)

## 2019-03-19 LAB — SURGICAL PATHOLOGY

## 2019-04-08 ENCOUNTER — Telehealth: Payer: Self-pay

## 2019-04-08 NOTE — Telephone Encounter (Signed)
Pt notified of procedure results via Mychart.

## 2019-04-08 NOTE — Telephone Encounter (Signed)
-----   Message from Virgel Manifold, MD sent at 04/08/2019  1:22 PM EDT ----- Your biopsies did not reveal any reasons for your diarrhea.  You should follow-up in our clinic to discuss future colonoscopies.  Debbie, please schedule clinic appointment in 1 to 2 months

## 2019-04-10 ENCOUNTER — Ambulatory Visit: Payer: Federal, State, Local not specified - PPO | Admitting: Gastroenterology

## 2020-07-09 IMAGING — CT CT ABDOMEN AND PELVIS WITHOUT CONTRAST
2 of 4 series · 16 of 46 positions shown, 18 images · non-contrast
Comparison: None.

CLINICAL DATA: Abdominal Manoj Deniz with nausea and vomiting

EXAM:
CT ABDOMEN AND PELVIS WITHOUT CONTRAST
TECHNIQUE: Multidetector CT imaging of the abdomen and pelvis was performed
following the standard protocol without oral or IV contrast.

[Series 2: routine abd/pel wo · axial · 0.79mm/px · z∈[-483,-23]mm · 13 of 100 slices shown, 15 images]
[im 4/100  soft-tissue]
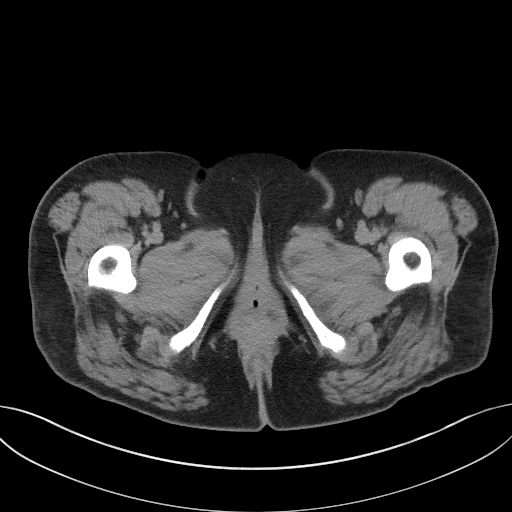
[im 4/100  bone]
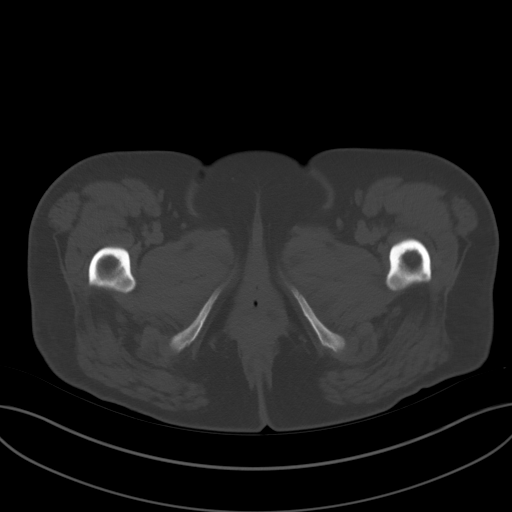
[im 12/100  soft-tissue]
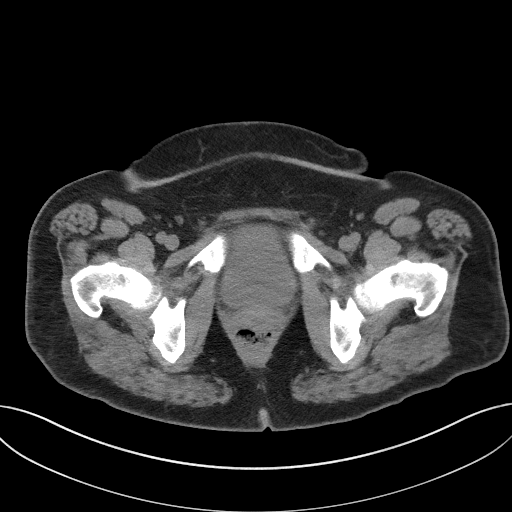
[im 20/100  soft-tissue]
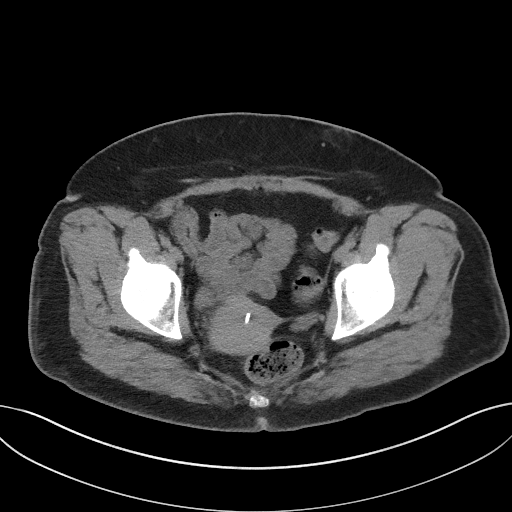
[im 28/100  soft-tissue]
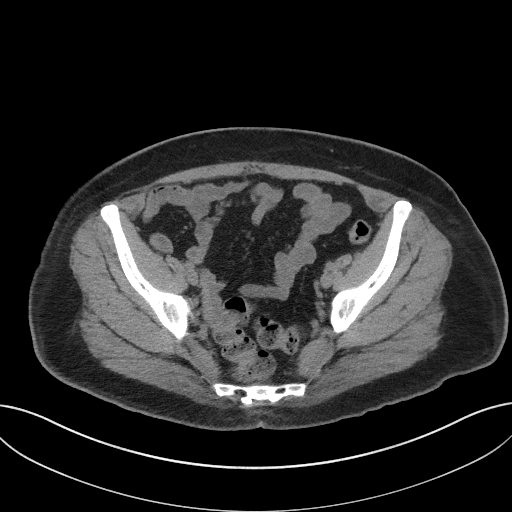
[im 36/100  soft-tissue]
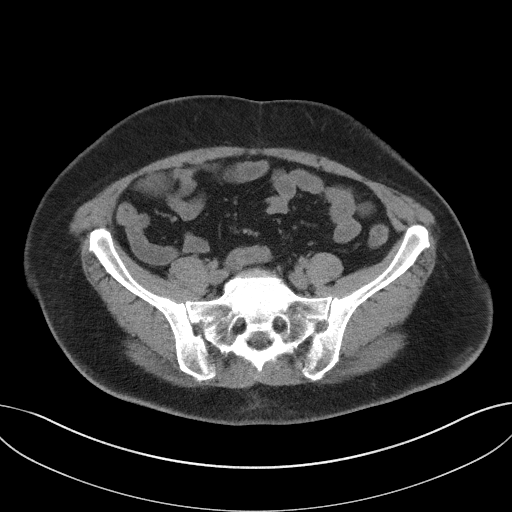
[im 44/100  soft-tissue]
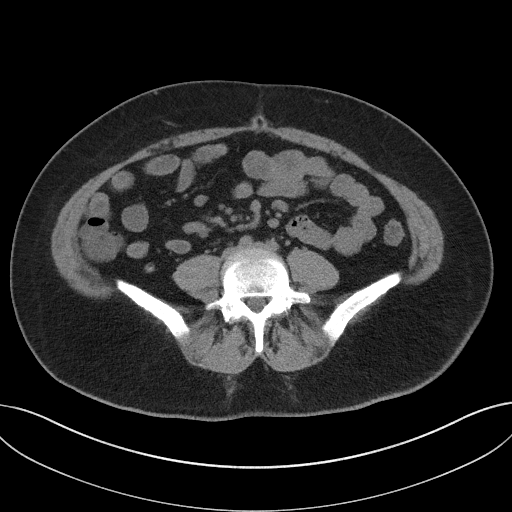
[im 52/100  soft-tissue]
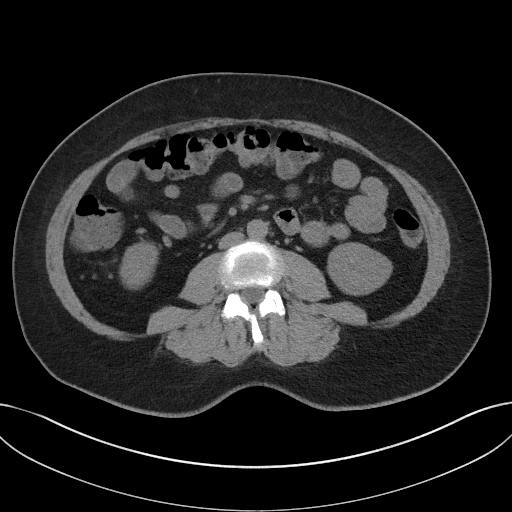
[im 56/100  soft-tissue]
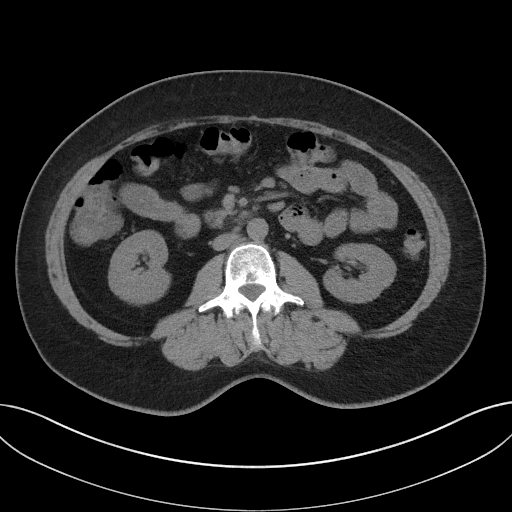
[im 64/100  soft-tissue]
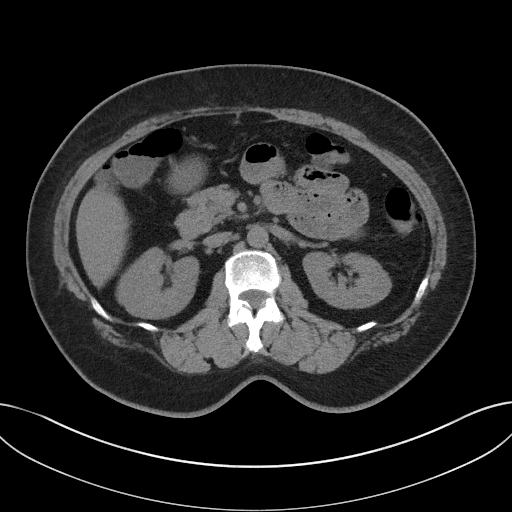
[im 64/100  bone]
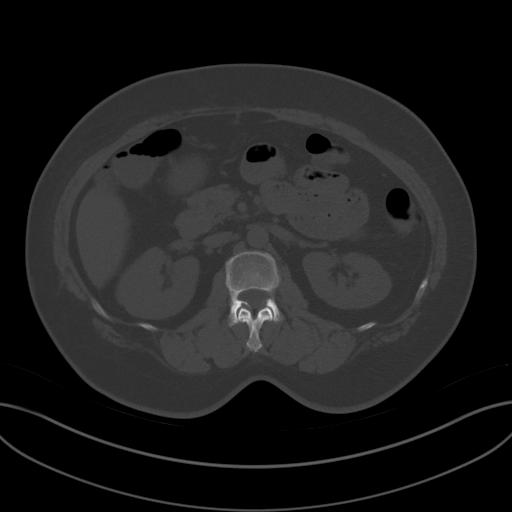
[im 72/100  soft-tissue]
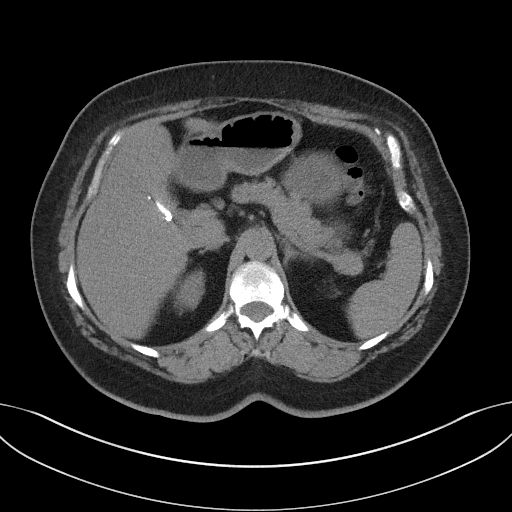
[im 80/100  soft-tissue]
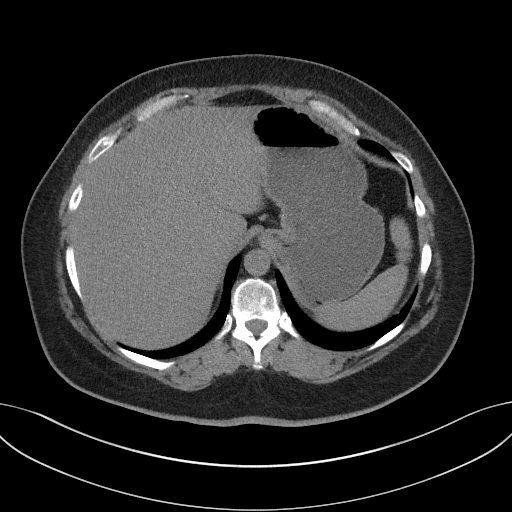
[im 88/100  soft-tissue]
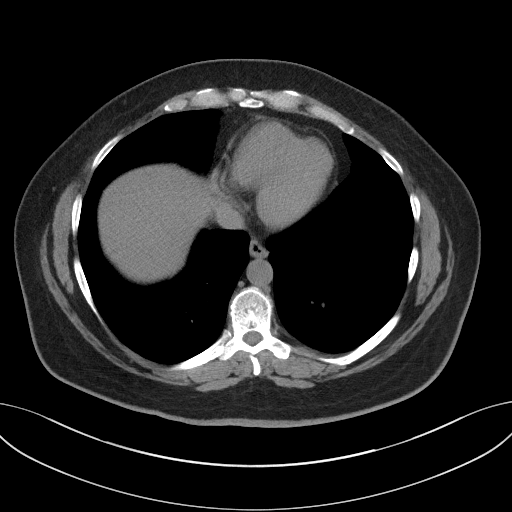
[im 96/100  soft-tissue]
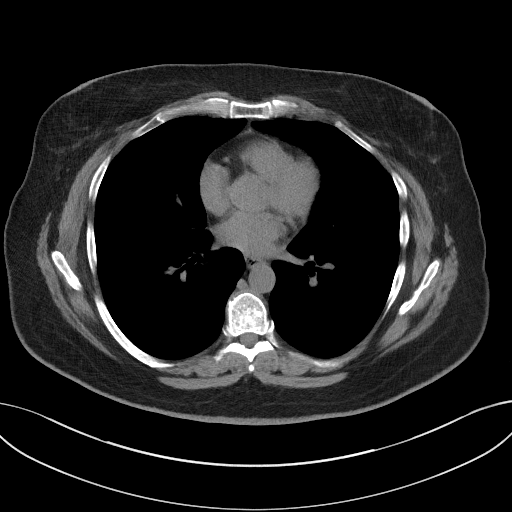

[Series 5: coronal st · coronal · 0.78mm/px · 3 of 93 slices shown]
[im 31/93  soft-tissue]
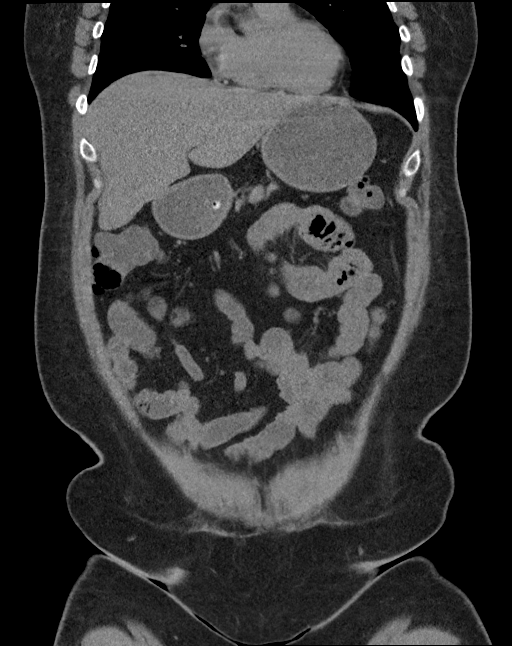
[im 41/93  soft-tissue]
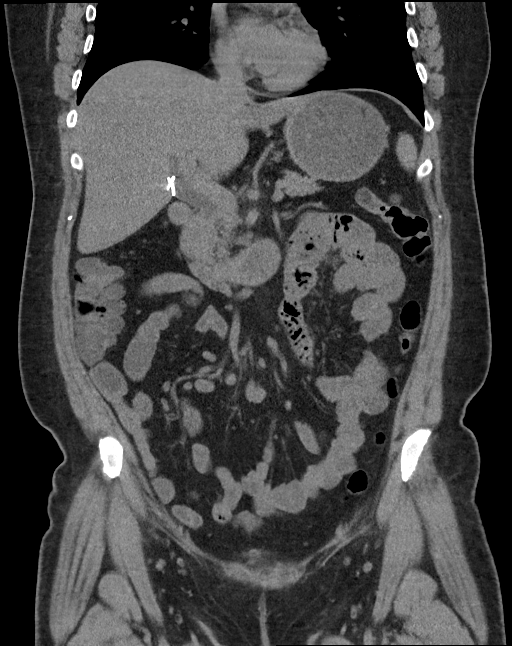
[im 52/93  soft-tissue]
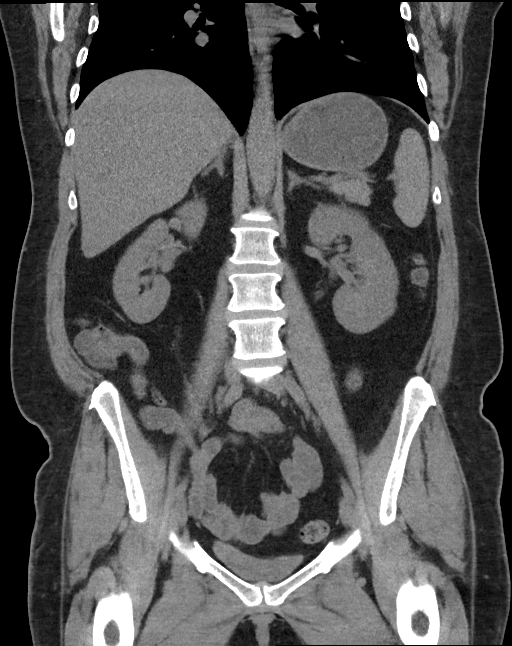

[16 of 46 positions shown; findings below may reference images not displayed]

FINDINGS: Lower chest: Lung bases are clear.

Hepatobiliary: No focal liver lesions are evident on this
noncontrast enhanced study. Gallbladder is absent. There is no
biliary duct dilatation.

Pancreas: There is no pancreatic mass or inflammatory focus.

Spleen: No splenic lesions are evident. A small accessory spleen is
noted anteriorly.

Adrenals/Urinary Tract: Adrenals bilaterally appear unremarkable.
There is no evident renal mass or hydronephrosis on either side.
There is a calculus in the mid right kidney measuring 6 x 4 mm. No
other renal calculi are evident. There is no appreciable ureteral
calculus on either side. Urinary bladder is midline with wall
thickness within normal limits.

Stomach/Bowel: There is no appreciable bowel wall or mesenteric
thickening. No evident bowel obstruction. The terminal ileum appears
normal. There is no free air or portal venous air.

Vascular/Lymphatic: There is no abdominal aortic aneurysm. No
vascular lesions are evident on this noncontrast enhanced study.

Reproductive: The uterus is retroverted. There is an intrauterine
device positioned within the endometrium. There is no evident pelvic
mass.

Other: The appendix appears normal. There is no abscess or ascites
in the abdomen or pelvis.

Musculoskeletal: There is slight anterior wedging several lower
thoracic vertebral bodies. No blastic or lytic bone lesions are
evident. There is no intramuscular or abdominal wall lesion.
IMPRESSION: 1. Nonobstructing calculus measuring 6 x 4 mm in the mid right
kidney. No hydronephrosis or ureteral calculus on either side.

2. No bowel obstruction. No abscess in the abdomen or pelvis.
Appendix appears normal.

3. Retroverted uterus. Intrauterine device positioned within the
endometrium.

4.  Gallbladder absent.

## 2020-07-14 IMAGING — US ULTRASOUND ABDOMEN COMPLETE
1 series · 14 of 25 positions shown · non-contrast
Comparison: CT 5 days ago

CLINICAL DATA: Upper abdominal pain. Elevated white blood cell
count.

EXAM:
ABDOMEN ULTRASOUND COMPLETE

[Series 1: ultrasound abdomen complete · 14 of 125 slices shown]
[im 1/125]
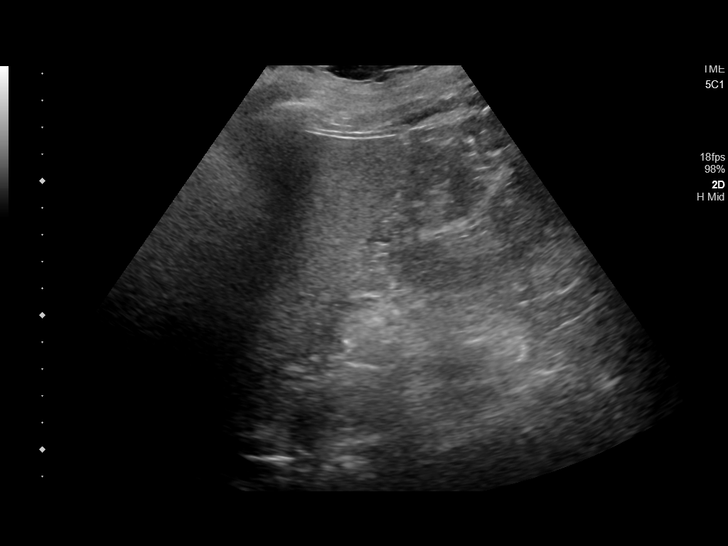
[im 11/125]
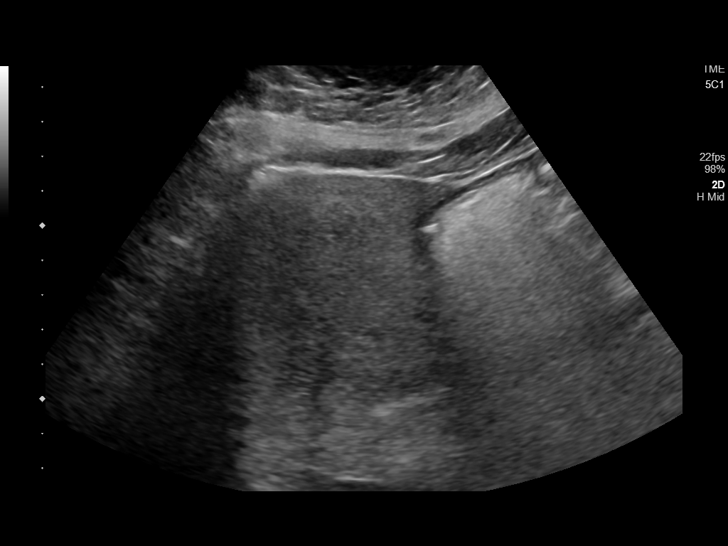
[im 21/125]
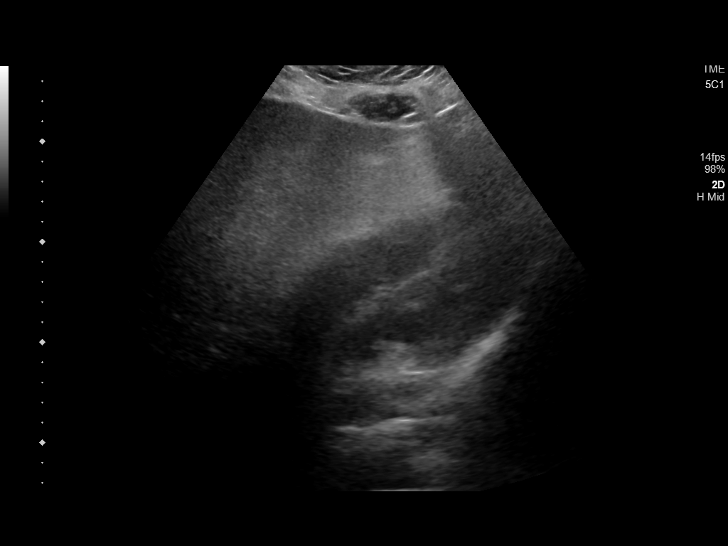
[im 32/125]
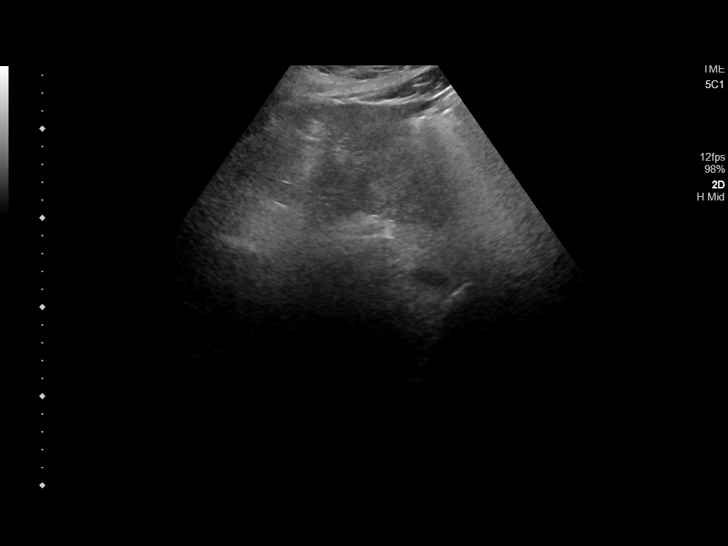
[im 42/125]
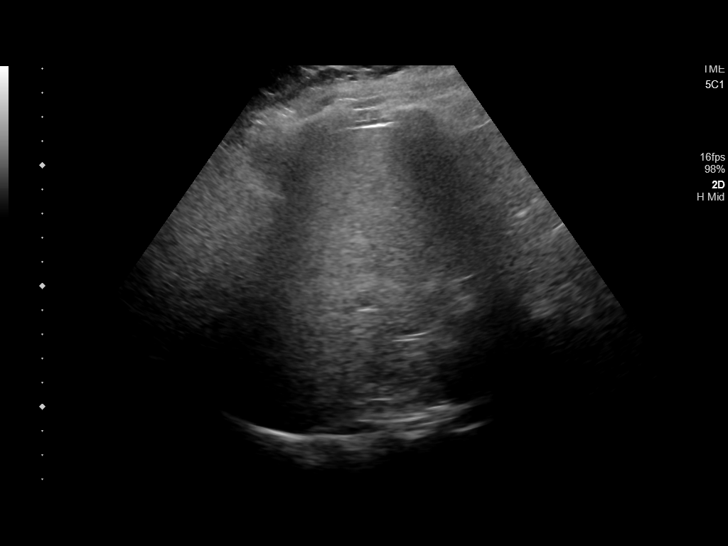
[im 47/125]
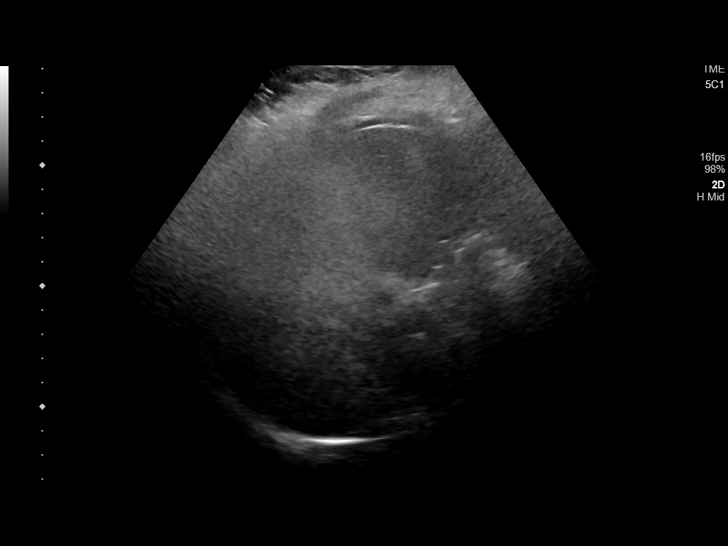
[im 57/125]
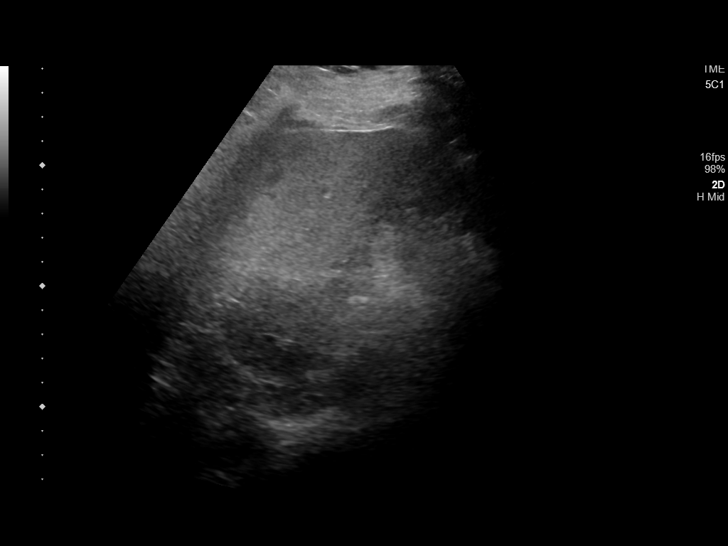
[im 68/125]
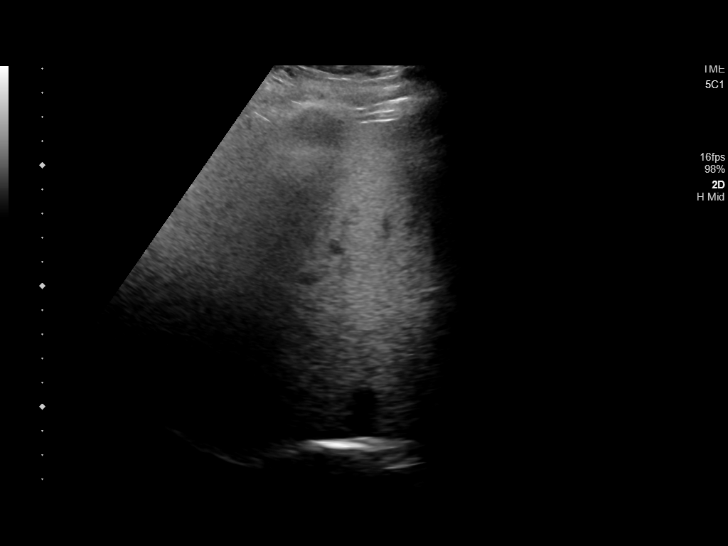
[im 78/125]
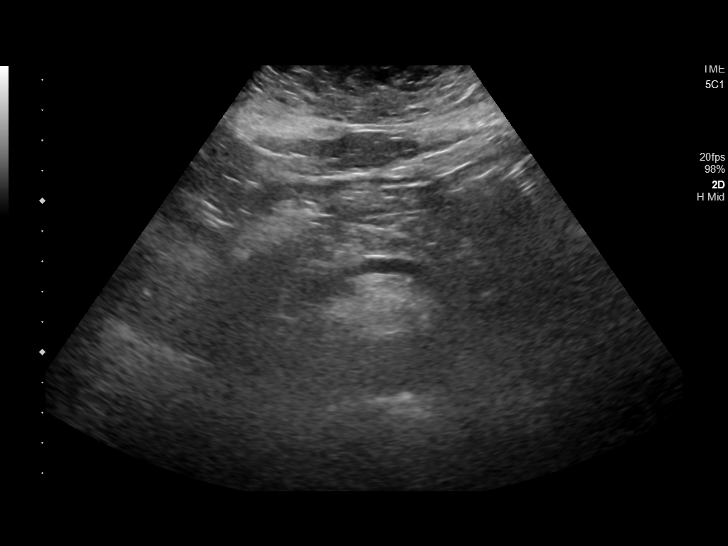
[im 83/125]
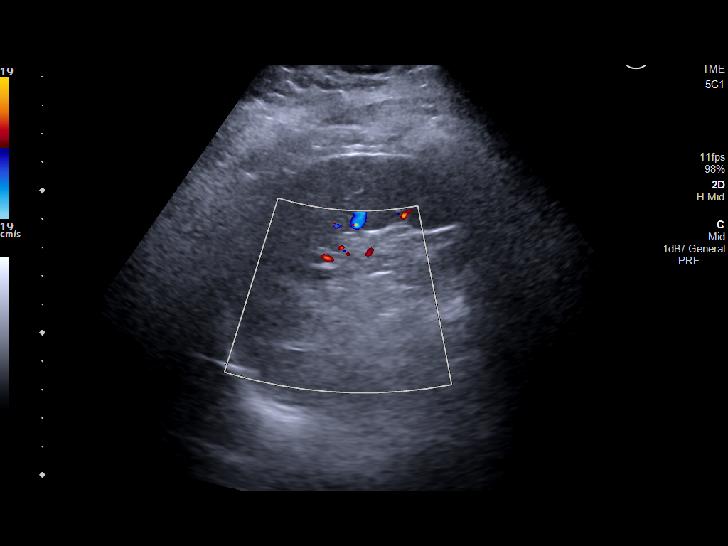
[im 94/125]
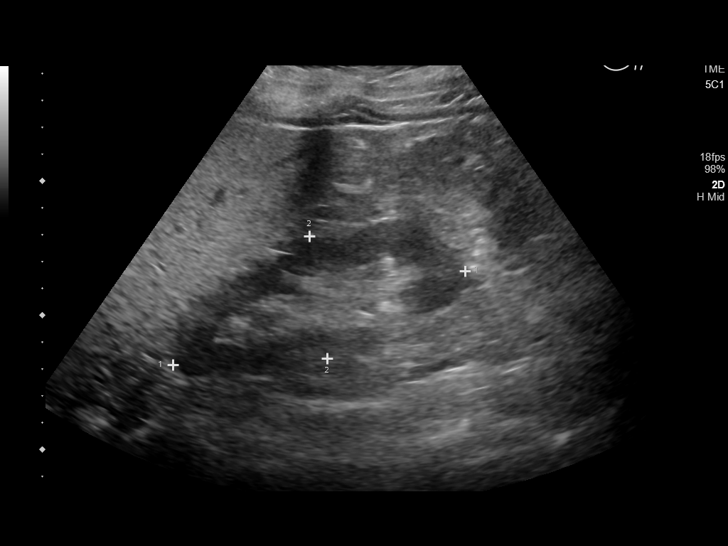
[im 104/125]
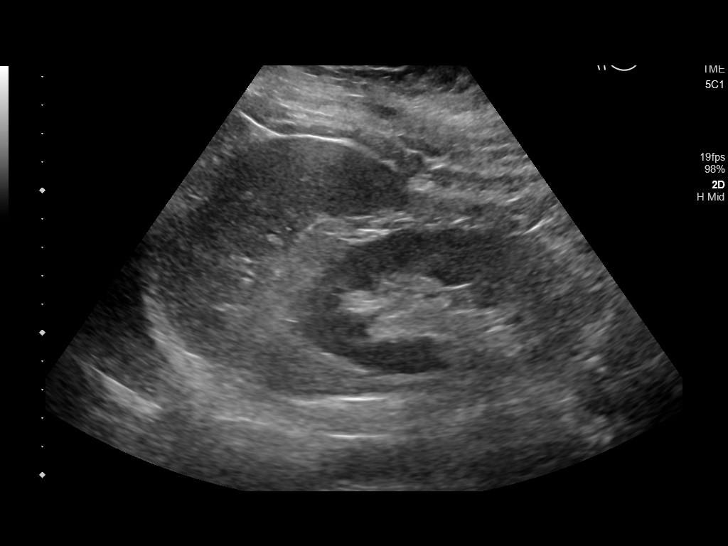
[im 114/125]
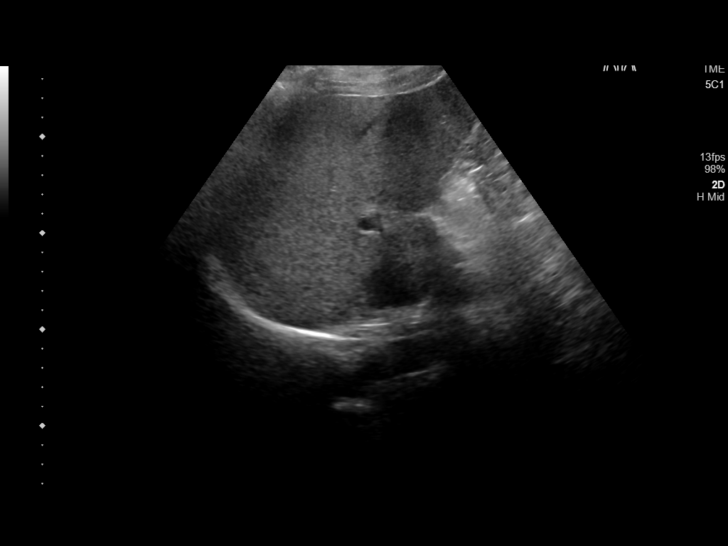
[im 125/125]
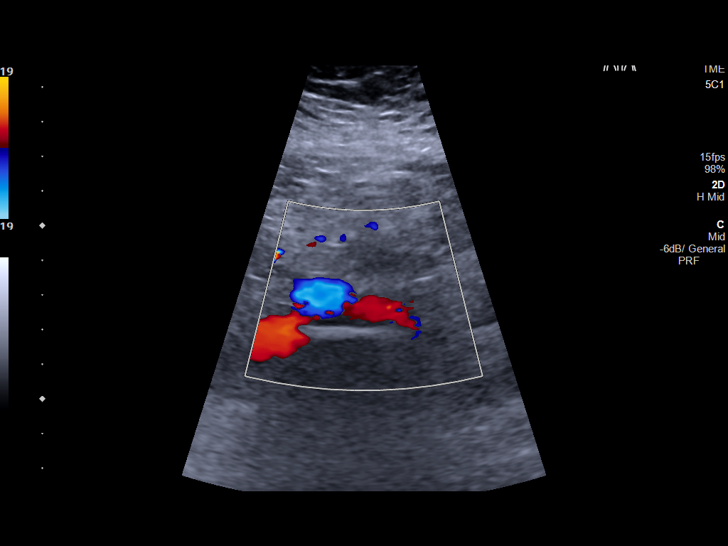

[14 of 25 positions shown; findings below may reference images not displayed]

FINDINGS: Gallbladder: Surgically absent.

Common bile duct: Diameter: 6 mm, normal.

Liver: No focal lesion identified. Mild diffusely increased in
parenchymal echogenicity. Portal vein is patent on color Doppler
imaging with normal direction of blood flow towards the liver.

IVC: No abnormality visualized.

Pancreas: Visualized portion unremarkable.

Spleen: Size and appearance within normal limits.

Right Kidney: Length: 11.4 cm. Echogenicity within normal limits.
Nonobstructing 7 mm stone in the mid kidney. No mass or
hydronephrosis visualized.

Left Kidney: Length: 11.1 cm. Echogenicity within normal limits. No
mass or hydronephrosis visualized.

Abdominal aorta: No aneurysm visualized.

Other findings: None.  No ascites.
IMPRESSION: 1. No acute findings or explanation for abdominal pain.
2. Nonobstructing right renal stone.
3. Mild hepatic steatosis.

## 2022-04-21 ENCOUNTER — Emergency Department: Payer: Federal, State, Local not specified - PPO

## 2022-04-21 ENCOUNTER — Encounter: Payer: Self-pay | Admitting: Emergency Medicine

## 2022-04-21 ENCOUNTER — Other Ambulatory Visit: Payer: Self-pay

## 2022-04-21 DIAGNOSIS — R519 Headache, unspecified: Secondary | ICD-10-CM | POA: Diagnosis present

## 2022-04-21 DIAGNOSIS — E119 Type 2 diabetes mellitus without complications: Secondary | ICD-10-CM | POA: Diagnosis present

## 2022-04-21 DIAGNOSIS — Z7984 Long term (current) use of oral hypoglycemic drugs: Secondary | ICD-10-CM

## 2022-04-21 DIAGNOSIS — J206 Acute bronchitis due to rhinovirus: Secondary | ICD-10-CM | POA: Diagnosis present

## 2022-04-21 DIAGNOSIS — Z7951 Long term (current) use of inhaled steroids: Secondary | ICD-10-CM

## 2022-04-21 DIAGNOSIS — Z882 Allergy status to sulfonamides status: Secondary | ICD-10-CM

## 2022-04-21 DIAGNOSIS — B9789 Other viral agents as the cause of diseases classified elsewhere: Secondary | ICD-10-CM | POA: Diagnosis present

## 2022-04-21 DIAGNOSIS — R0902 Hypoxemia: Secondary | ICD-10-CM | POA: Diagnosis present

## 2022-04-21 DIAGNOSIS — Z841 Family history of disorders of kidney and ureter: Secondary | ICD-10-CM

## 2022-04-21 DIAGNOSIS — Z91013 Allergy to seafood: Secondary | ICD-10-CM

## 2022-04-21 DIAGNOSIS — Z888 Allergy status to other drugs, medicaments and biological substances status: Secondary | ICD-10-CM

## 2022-04-21 DIAGNOSIS — Z79899 Other long term (current) drug therapy: Secondary | ICD-10-CM

## 2022-04-21 DIAGNOSIS — J189 Pneumonia, unspecified organism: Principal | ICD-10-CM | POA: Diagnosis present

## 2022-04-21 DIAGNOSIS — Z6835 Body mass index (BMI) 35.0-35.9, adult: Secondary | ICD-10-CM

## 2022-04-21 DIAGNOSIS — F32A Depression, unspecified: Secondary | ICD-10-CM | POA: Diagnosis present

## 2022-04-21 DIAGNOSIS — Z20822 Contact with and (suspected) exposure to covid-19: Secondary | ICD-10-CM | POA: Diagnosis present

## 2022-04-21 DIAGNOSIS — E669 Obesity, unspecified: Secondary | ICD-10-CM | POA: Diagnosis present

## 2022-04-21 DIAGNOSIS — E7800 Pure hypercholesterolemia, unspecified: Secondary | ICD-10-CM | POA: Diagnosis present

## 2022-04-21 DIAGNOSIS — K219 Gastro-esophageal reflux disease without esophagitis: Secondary | ICD-10-CM | POA: Diagnosis present

## 2022-04-21 DIAGNOSIS — Z91041 Radiographic dye allergy status: Secondary | ICD-10-CM

## 2022-04-21 DIAGNOSIS — I10 Essential (primary) hypertension: Secondary | ICD-10-CM | POA: Diagnosis present

## 2022-04-21 LAB — BASIC METABOLIC PANEL
Anion gap: 11 (ref 5–15)
BUN: 9 mg/dL (ref 6–20)
CO2: 26 mmol/L (ref 22–32)
Calcium: 9.7 mg/dL (ref 8.9–10.3)
Chloride: 101 mmol/L (ref 98–111)
Creatinine, Ser: 0.66 mg/dL (ref 0.44–1.00)
GFR, Estimated: >60 mL/min (ref >60)
Glucose, Bld: 273 mg/dL — ABNORMAL HIGH (ref 70–99)
Potassium: 4.3 mmol/L (ref 3.5–5.1)
Sodium: 138 mmol/L (ref 135–145)

## 2022-04-21 LAB — CBC
HCT: 41.1 % (ref 36.0–46.0)
Hemoglobin: 13.3 g/dL (ref 12.0–15.0)
MCH: 25.7 pg — ABNORMAL LOW (ref 26.0–34.0)
MCHC: 32.4 g/dL (ref 30.0–36.0)
MCV: 79.3 fL — ABNORMAL LOW (ref 80.0–100.0)
Platelets: 259 10*3/uL (ref 150–400)
RBC: 5.18 MIL/uL — ABNORMAL HIGH (ref 3.87–5.11)
RDW: 12.7 % (ref 11.5–15.5)
WBC: 9.3 10*3/uL (ref 4.0–10.5)
nRBC: 0 % (ref 0.0–0.2)

## 2022-04-21 LAB — TROPONIN I (HIGH SENSITIVITY): Troponin I (High Sensitivity): 4 ng/L (ref <18)

## 2022-04-21 NOTE — ED Triage Notes (Signed)
Pt to ED via POV, pt states was dx with bronchitis several days ago, states has been being treated at home with nebulizer and inhaler. Pt c/o increasing chest tightness and worsening HA. Pt also reports decreased PO intake and also is a diabetic.   Pt states HA worsens with movement at this time, is also noted to be A&O x4 and able to speak in full and complete sentences. Pt placed on 2L via [place] in triage.

## 2022-04-22 ENCOUNTER — Encounter: Payer: Self-pay | Admitting: Internal Medicine

## 2022-04-22 ENCOUNTER — Inpatient Hospital Stay
Admission: EM | Admit: 2022-04-22 | Discharge: 2022-04-24 | DRG: 195 | Disposition: A | Discharge status: Home / Self Care | Payer: Federal, State, Local not specified - PPO | Attending: Internal Medicine | Admitting: Internal Medicine

## 2022-04-22 DIAGNOSIS — Z888 Allergy status to other drugs, medicaments and biological substances status: Secondary | ICD-10-CM | POA: Diagnosis not present

## 2022-04-22 DIAGNOSIS — I10 Essential (primary) hypertension: Secondary | ICD-10-CM | POA: Diagnosis present

## 2022-04-22 DIAGNOSIS — E119 Type 2 diabetes mellitus without complications: Secondary | ICD-10-CM

## 2022-04-22 DIAGNOSIS — Z882 Allergy status to sulfonamides status: Secondary | ICD-10-CM | POA: Diagnosis not present

## 2022-04-22 DIAGNOSIS — E669 Obesity, unspecified: Secondary | ICD-10-CM | POA: Diagnosis present

## 2022-04-22 DIAGNOSIS — Z91041 Radiographic dye allergy status: Secondary | ICD-10-CM | POA: Diagnosis not present

## 2022-04-22 DIAGNOSIS — F32A Depression, unspecified: Secondary | ICD-10-CM | POA: Diagnosis present

## 2022-04-22 DIAGNOSIS — Z6835 Body mass index (BMI) 35.0-35.9, adult: Secondary | ICD-10-CM | POA: Diagnosis not present

## 2022-04-22 DIAGNOSIS — J189 Pneumonia, unspecified organism: Secondary | ICD-10-CM | POA: Diagnosis present

## 2022-04-22 DIAGNOSIS — E785 Hyperlipidemia, unspecified: Secondary | ICD-10-CM | POA: Diagnosis present

## 2022-04-22 DIAGNOSIS — Z91013 Allergy to seafood: Secondary | ICD-10-CM | POA: Diagnosis not present

## 2022-04-22 DIAGNOSIS — R0902 Hypoxemia: Secondary | ICD-10-CM | POA: Diagnosis present

## 2022-04-22 DIAGNOSIS — B348 Other viral infections of unspecified site: Secondary | ICD-10-CM | POA: Diagnosis present

## 2022-04-22 DIAGNOSIS — Z7984 Long term (current) use of oral hypoglycemic drugs: Secondary | ICD-10-CM | POA: Diagnosis not present

## 2022-04-22 DIAGNOSIS — Z7951 Long term (current) use of inhaled steroids: Secondary | ICD-10-CM | POA: Diagnosis not present

## 2022-04-22 DIAGNOSIS — Z20822 Contact with and (suspected) exposure to covid-19: Secondary | ICD-10-CM | POA: Diagnosis present

## 2022-04-22 DIAGNOSIS — R519 Headache, unspecified: Secondary | ICD-10-CM | POA: Diagnosis present

## 2022-04-22 DIAGNOSIS — J206 Acute bronchitis due to rhinovirus: Secondary | ICD-10-CM | POA: Diagnosis present

## 2022-04-22 DIAGNOSIS — Z841 Family history of disorders of kidney and ureter: Secondary | ICD-10-CM | POA: Diagnosis not present

## 2022-04-22 DIAGNOSIS — B9789 Other viral agents as the cause of diseases classified elsewhere: Secondary | ICD-10-CM | POA: Diagnosis present

## 2022-04-22 DIAGNOSIS — Z79899 Other long term (current) drug therapy: Secondary | ICD-10-CM | POA: Diagnosis not present

## 2022-04-22 DIAGNOSIS — E7800 Pure hypercholesterolemia, unspecified: Secondary | ICD-10-CM | POA: Diagnosis present

## 2022-04-22 DIAGNOSIS — K219 Gastro-esophageal reflux disease without esophagitis: Secondary | ICD-10-CM | POA: Diagnosis present

## 2022-04-22 LAB — GLUCOSE, CAPILLARY
Glucose-Capillary: 272 mg/dL — ABNORMAL HIGH (ref 70–99)
Glucose-Capillary: 183 mg/dL — ABNORMAL HIGH (ref 70–99)
Glucose-Capillary: 227 mg/dL — ABNORMAL HIGH (ref 70–99)

## 2022-04-22 LAB — PROCALCITONIN: Procalcitonin: <0.1 ng/mL

## 2022-04-22 LAB — HEMOGLOBIN A1C
Hgb A1c MFr Bld: 8.6 % — ABNORMAL HIGH (ref 4.8–5.6)
Mean Plasma Glucose: 200.12 mg/dL

## 2022-04-22 LAB — SARS CORONAVIRUS 2 BY RT PCR: SARS Coronavirus 2 by RT PCR: NEGATIVE

## 2022-04-22 LAB — GROUP A STREP BY PCR: Group A Strep by PCR: NOT DETECTED

## 2022-04-22 LAB — TROPONIN I (HIGH SENSITIVITY): Troponin I (High Sensitivity): 3 ng/L (ref <18)

## 2022-04-22 LAB — CBG MONITORING, ED: Glucose-Capillary: 233 mg/dL — ABNORMAL HIGH (ref 70–99)

## 2022-04-22 LAB — STREP PNEUMONIAE URINARY ANTIGEN: Strep Pneumo Urinary Antigen: NEGATIVE

## 2022-04-22 LAB — HIV ANTIBODY (ROUTINE TESTING W REFLEX): HIV Screen 4th Generation wRfx: NONREACTIVE

## 2022-04-22 MED ORDER — SODIUM CHLORIDE 0.9 % IV SOLN
500.0000 mg | INTRAVENOUS | Status: DC
  Administered 2022-04-22 – 2022-04-23 (×2): 500 mg via INTRAVENOUS
  Filled 2022-04-22: qty 5
  Filled 2022-04-22: qty 500

## 2022-04-22 MED ORDER — IPRATROPIUM-ALBUTEROL 0.5-2.5 (3) MG/3ML IN SOLN
3.0000 mL | Freq: Four times a day (QID) | RESPIRATORY_TRACT | Status: DC
  Administered 2022-04-22 – 2022-04-23 (×4): 3 mL via RESPIRATORY_TRACT
  Filled 2022-04-22 (×4): qty 3

## 2022-04-22 MED ORDER — ACETAMINOPHEN 650 MG RE SUPP: 650.0000 mg | Freq: Four times a day (QID) | RECTAL | Status: DC | PRN

## 2022-04-22 MED ORDER — ONDANSETRON HCL 4 MG PO TABS: 4.0000 mg | ORAL_TABLET | Freq: Four times a day (QID) | ORAL | Status: DC | PRN

## 2022-04-22 MED ORDER — ALBUTEROL SULFATE (2.5 MG/3ML) 0.083% IN NEBU: 2.5000 mg | INHALATION_SOLUTION | RESPIRATORY_TRACT | Status: DC | PRN

## 2022-04-22 MED ORDER — INSULIN ASPART 100 UNIT/ML IJ SOLN
0.0000 [IU] | Freq: Every day | INTRAMUSCULAR | Status: DC
  Administered 2022-04-22 – 2022-04-23 (×2): 2 [IU] via SUBCUTANEOUS
  Filled 2022-04-22 (×2): qty 1

## 2022-04-22 MED ORDER — HYDRALAZINE HCL 20 MG/ML IJ SOLN: 5.0000 mg | INTRAMUSCULAR | Status: DC | PRN

## 2022-04-22 MED ORDER — ACETAMINOPHEN 325 MG PO TABS
650.0000 mg | ORAL_TABLET | Freq: Four times a day (QID) | ORAL | Status: DC | PRN
  Administered 2022-04-22 – 2022-04-23 (×3): 650 mg via ORAL
  Filled 2022-04-22 (×3): qty 2

## 2022-04-22 MED ORDER — FAMOTIDINE 20 MG PO TABS
40.0000 mg | ORAL_TABLET | Freq: Every day | ORAL | Status: DC
  Administered 2022-04-22 – 2022-04-24 (×3): 40 mg via ORAL
  Filled 2022-04-22 (×3): qty 2

## 2022-04-22 MED ORDER — ENOXAPARIN SODIUM 60 MG/0.6ML IJ SOSY
0.5000 mg/kg | PREFILLED_SYRINGE | INTRAMUSCULAR | Status: DC
Start: 2022-04-22 — End: 2022-04-24
  Administered 2022-04-22 – 2022-04-24 (×3): 45 mg via SUBCUTANEOUS
  Filled 2022-04-22 (×3): qty 0.6

## 2022-04-22 MED ORDER — SODIUM CHLORIDE 0.9 % IV SOLN: INTRAVENOUS | Status: DC

## 2022-04-22 MED ORDER — ROSUVASTATIN CALCIUM 5 MG PO TABS
5.0000 mg | ORAL_TABLET | Freq: Every evening | ORAL | Status: DC
  Administered 2022-04-22 – 2022-04-23 (×2): 5 mg via ORAL
  Filled 2022-04-22 (×2): qty 1

## 2022-04-22 MED ORDER — LISINOPRIL 10 MG PO TABS
10.0000 mg | ORAL_TABLET | Freq: Every day | ORAL | Status: DC
  Administered 2022-04-22 – 2022-04-24 (×3): 10 mg via ORAL
  Filled 2022-04-22 (×3): qty 1

## 2022-04-22 MED ORDER — LORATADINE 10 MG PO TABS
10.0000 mg | ORAL_TABLET | Freq: Every day | ORAL | Status: DC
  Administered 2022-04-22 – 2022-04-24 (×3): 10 mg via ORAL
  Filled 2022-04-22 (×3): qty 1

## 2022-04-22 MED ORDER — DM-GUAIFENESIN ER 30-600 MG PO TB12
1.0000 | ORAL_TABLET | Freq: Two times a day (BID) | ORAL | Status: DC | PRN
  Administered 2022-04-22 – 2022-04-24 (×3): 1 via ORAL
  Filled 2022-04-22 (×4): qty 1

## 2022-04-22 MED ORDER — SODIUM CHLORIDE 0.9 % IV SOLN
500.0000 mg | Freq: Once | INTRAVENOUS | Status: DC
  Filled 2022-04-22 (×2): qty 5

## 2022-04-22 MED ORDER — TRAZODONE HCL 50 MG PO TABS: 25.0000 mg | ORAL_TABLET | Freq: Every evening | ORAL | Status: DC | PRN

## 2022-04-22 MED ORDER — CITALOPRAM HYDROBROMIDE 10 MG PO TABS
10.0000 mg | ORAL_TABLET | Freq: Every day | ORAL | Status: DC
  Administered 2022-04-22 – 2022-04-24 (×3): 10 mg via ORAL
  Filled 2022-04-22 (×3): qty 1

## 2022-04-22 MED ORDER — MAGNESIUM HYDROXIDE 400 MG/5ML PO SUSP: 30.0000 mL | Freq: Every day | ORAL | Status: DC | PRN

## 2022-04-22 MED ORDER — PANTOPRAZOLE SODIUM 20 MG PO TBEC
20.0000 mg | DELAYED_RELEASE_TABLET | Freq: Every day | ORAL | Status: DC
  Administered 2022-04-22 – 2022-04-24 (×3): 20 mg via ORAL
  Filled 2022-04-22 (×3): qty 1

## 2022-04-22 MED ORDER — MOMETASONE FURO-FORMOTEROL FUM 100-5 MCG/ACT IN AERO
2.0000 | INHALATION_SPRAY | Freq: Two times a day (BID) | RESPIRATORY_TRACT | Status: DC
  Administered 2022-04-22 – 2022-04-24 (×4): 2 via RESPIRATORY_TRACT
  Filled 2022-04-22: qty 8.8

## 2022-04-22 MED ORDER — SODIUM CHLORIDE 0.9 % IV SOLN
2.0000 g | INTRAVENOUS | Status: DC
  Administered 2022-04-23 – 2022-04-24 (×2): 2 g via INTRAVENOUS
  Filled 2022-04-22 (×2): qty 2

## 2022-04-22 MED ORDER — SODIUM CHLORIDE 0.9 % IV SOLN
1.0000 g | Freq: Once | INTRAVENOUS | Status: AC
  Administered 2022-04-22: 1 g via INTRAVENOUS
  Filled 2022-04-22: qty 10

## 2022-04-22 MED ORDER — SODIUM CHLORIDE 0.9 % IV SOLN
1.0000 g | Freq: Once | INTRAVENOUS | Status: AC
  Administered 2022-04-22: 1 g via INTRAVENOUS
  Filled 2022-04-22: qty 1

## 2022-04-22 MED ORDER — INSULIN ASPART 100 UNIT/ML IJ SOLN
0.0000 [IU] | Freq: Three times a day (TID) | INTRAMUSCULAR | Status: DC
  Administered 2022-04-22: 2 [IU] via SUBCUTANEOUS
  Administered 2022-04-22: 5 [IU] via SUBCUTANEOUS
  Administered 2022-04-23: 2 [IU] via SUBCUTANEOUS
  Administered 2022-04-23 – 2022-04-24 (×3): 3 [IU] via SUBCUTANEOUS
  Filled 2022-04-22 (×6): qty 1

## 2022-04-22 MED ORDER — ONDANSETRON HCL 4 MG/2ML IJ SOLN: 4.0000 mg | Freq: Four times a day (QID) | INTRAMUSCULAR | Status: DC | PRN

## 2022-04-22 MED ORDER — MONTELUKAST SODIUM 10 MG PO TABS
10.0000 mg | ORAL_TABLET | Freq: Every day | ORAL | Status: DC
  Administered 2022-04-22 – 2022-04-23 (×2): 10 mg via ORAL
  Filled 2022-04-22 (×2): qty 1

## 2022-04-22 NOTE — Assessment & Plan Note (Addendum)
Presented with shortness of breath and initially required 2 L new oxygen requirement.   Did not meet SIRS criteria, not septic. Respiratory viral panel positive for rhinovirus. Suspect secondary bacterial pneumonia superimposed on viral infection. --Treated with empiric Rocephin and Zithromax --Discharged on PO Zithromax and Omnicef since sputum cultures with prelim bacterial findings are still pending. --Supportive care with Mucinex for cough, bronchodilators --Ambulating well without need for supplemental O2 --PCP follow up in 1-2 weeks

## 2022-04-22 NOTE — Assessment & Plan Note (Addendum)
A1c 8.5 on 01/04/2018, poorly controlled.   Repeat A1c 8.6% Resume home regimen at discharge. Covered with sliding scale NovoLog during admission.

## 2022-04-22 NOTE — H&P (Signed)
History and Physical    Andrea Shields ZYS:[id number] DOB: [birth year] DOA: 04/22/2022  Referring MD/NP/PA:   PCP: Jacklynn Bue, MD   Patient coming from:  The patient is coming from home.  At baseline, pt is independent for most of ADL.        Chief Complaint: Shortness of breath  HPI: Andrea Shields is a 51 y.o. female with medical history significant of hypertension, hyperlipidemia, diabetes mellitus, GERD, depression, obesity obesity BMI 35.43, who presents with shortness of breath.  Patient states that she has shortness breath for more than 6 days.  She has productive cough and chest tightness.  No fever or chills.  Patient initially had sore throat, which has resolved.  Patient also has headache.  Pt has bee using nebulizer and inhaler at home without significant improvement.  Patient does not have nausea, vomiting, diarrhea or abdominal pain.  No symptoms of UTI.  Patient normally is not using oxygen at home.  Oxygen desaturation to 88% on room air in ED, which improved to 97% on 2 L oxygen.  Data reviewed independently and ED Course: pt was found to have WBC 9.3, troponin level 3, 4, negative COVID PCR, negative rapid strep PCR, GFR> 60, temperature normal, blood pressure 131/69, heart rate 89, RR 24.  Chest x-ray showed patchy infiltration in lingular lobe.  CT head negative for acute intracranial abnormalities, but showed possible sinusitis.  Patient is admitted to telemetry bed as inpatient.   EKG: I have personally reviewed.  Sinus rhythm, QTc 454, LAE, no ischemic change.   Review of Systems:   General: no fevers, chills, no body weight gain,  has fatigue HEENT: no blurry vision, hearing changes or sore throat Respiratory: Has dyspnea, coughing, no wheezing CV: Has chest tightness.  No palpitations GI: no nausea, vomiting, abdominal pain, diarrhea, constipation GU: no dysuria, burning on urination, increased urinary frequency, hematuria  Ext: no leg edema Neuro: no  unilateral weakness, numbness, or tingling, no vision change or hearing loss Skin: no rash, no skin tear. MSK: No muscle spasm, no deformity, no limitation of range of movement in spin Heme: No easy bruising.  Travel history: No recent long distant travel.   Allergy:  Allergies  Allergen Reactions   Iodinated Contrast Media Anaphylaxis   Shellfish-Derived Products Anaphylaxis   Sulfa Antibiotics Hives   Other     Pneumonia vaccine caused arm swelling   Shellfish Allergy    Sitagliptin Nausea And Vomiting    Past Medical History:  Diagnosis Date   Diabetes mellitus without complication (HCC)    High cholesterol    Hypertension     Past Surgical History:  Procedure Laterality Date   CESAREAN SECTION     CHOLECYSTECTOMY     COLONOSCOPY WITH PROPOFOL N/A 03/15/2019   Procedure: COLONOSCOPY WITH PROPOFOL;  Surgeon: Pasty Spillers, MD;  Location: [facility] ENDOSCOPY;  Service: Endoscopy;  Laterality: N/A;   ESOPHAGOGASTRODUODENOSCOPY N/A 03/15/2019   Procedure: ESOPHAGOGASTRODUODENOSCOPY (EGD);  Surgeon: Pasty Spillers, MD;  Location: [facility] ENDOSCOPY;  Service: Endoscopy;  Laterality: N/A;    Social History:  reports that she has never smoked. She has never used smokeless tobacco. No history on file for alcohol use and drug use.  Family History:  Family History  Problem Relation Age of Onset   Kidney disease Mother      Prior to Admission medications   Medication Sig Start Date End Date Taking? Authorizing Provider  albuterol (PROVENTIL) (2.5 MG/3ML) 0.083% nebulizer solution Take 2.5 mg by  nebulization every 4 (four) hours as needed. 04/21/22  Yes [provider]  budesonide-formoterol (SYMBICORT) 80-4.5 MCG/ACT inhaler SMARTSIG:By Mouth 04/19/22  Yes [provider]  cetirizine (ZYRTEC) 10 MG tablet Take 10 mg by mouth daily.   Yes [provider]  citalopram (CELEXA) 10 MG tablet Take 10 mg by mouth daily. 02/20/22  Yes [provider]  famotidine (PEPCID) 40 MG tablet Take 40 mg by mouth daily. 04/07/22  Yes [provider]  lisinopril (ZESTRIL) 10 MG tablet Take 10 mg by mouth daily. 01/28/22  Yes [provider]  metFORMIN (GLUCOPHAGE-XR) 500 MG 24 hr tablet Take 1,000 mg by mouth 2 (two) times daily. 01/29/22  Yes [provider]  montelukast (SINGULAIR) 10 MG tablet Take 10 mg by mouth daily. 10/06/18  Yes [provider]  OZEMPIC, 2 MG/DOSE, 8 MG/3ML SOPN Inject into the skin. 03/14/22  Yes [provider]  pantoprazole (PROTONIX) 20 MG tablet Take 20 mg by mouth daily.   Yes [provider]  pioglitazone (ACTOS) 30 MG tablet Take 30 mg by mouth daily. 03/25/22  Yes [provider]  rosuvastatin (CRESTOR) 5 MG tablet Take 5 mg by mouth daily. 01/27/22  Yes [provider]  albuterol (VENTOLIN HFA) 108 (90 Base) MCG/ACT inhaler Inhale into the lungs. 04/20/22   [provider]  empagliflozin (JARDIANCE) 25 MG TABS tablet Take 25 mg by mouth daily. Patient not taking: Reported on 04/22/2022    [provider]    Physical Exam: Vitals:   04/22/22 0330 04/22/22 0522 04/22/22 0923 04/22/22 1655  BP: 116/70 127/61 131/69 130/68  Pulse: 76 81 87 92  Resp: 14 16    Temp:  98.2 F (36.8 C) 98.5 F (36.9 C) 98.4 F (36.9 C)  TempSrc:      SpO2: 99% 100% 97% 99%  Weight:      Height:       General: Not in acute distress HEENT:       Eyes: PERRL, EOMI, no scleral icterus.       ENT: No discharge from the ears and nose, no pharynx injection, no tonsillar enlargement.        Neck: No JVD, no bruit, no mass felt. Heme: No neck lymph node enlargement. Cardiac: S1/S2, RRR, No murmurs, No gallops or rubs. Respiratory: Has coarse breathing sound bilaterally GI: Soft, nondistended, nontender, no rebound pain, no organomegaly, BS present. GU: No hematuria Ext: No pitting leg edema bilaterally. 1+DP/PT pulse bilaterally. Musculoskeletal: No  joint deformities, No joint redness or warmth, no limitation of ROM in spin. Skin: No rashes.  Neuro: Alert, oriented X3, cranial nerves II-XII grossly intact, moves all extremities normally.  Psych: Patient is not psychotic, no suicidal or hemocidal ideation.  Labs on Admission: I have personally reviewed following labs and imaging studies  CBC: Recent Labs  Lab 04/21/22 2018  WBC 9.3  HGB 13.3  HCT 41.1  MCV 79.3*  PLT 259   Basic Metabolic Panel: Recent Labs  Lab 04/21/22 2018  NA 138  K 4.3  CL 101  CO2 26  GLUCOSE 273*  BUN 9  CREATININE 0.66  CALCIUM 9.7   GFR: Estimated Creatinine Clearance: 90.9 mL/min (by C-G formula based on SCr of 0.66 mg/dL). Liver Function Tests: No results for input(s): "AST", "ALT", "ALKPHOS", "BILITOT", "PROT", "ALBUMIN" in the last 168 hours. No results for input(s): "LIPASE", "AMYLASE" in the last 168 hours. No results for input(s): "AMMONIA" in the last 168 hours. Coagulation  Profile: No results for input(s): "INR", "PROTIME" in the last 168 hours. Cardiac Enzymes: No results for input(s): "CKTOTAL", "CKMB", "CKMBINDEX", "TROPONINI" in the last 168 hours. BNP (last 3 results) No results for input(s): "PROBNP" in the last 8760 hours. HbA1C: Recent Labs    04/22/22 0621  HGBA1C 8.6*   CBG: Recent Labs  Lab 04/22/22 0350 04/22/22 1154 04/22/22 1629  GLUCAP 233* 272* 183*   Lipid Profile: No results for input(s): "CHOL", "HDL", "LDLCALC", "TRIG", "CHOLHDL", "LDLDIRECT" in the last 72 hours. Thyroid Function Tests: No results for input(s): "TSH", "T4TOTAL", "FREET4", "T3FREE", "THYROIDAB" in the last 72 hours. Anemia Panel: No results for input(s): "VITAMINB12", "FOLATE", "FERRITIN", "TIBC", "IRON", "RETICCTPCT" in the last 72 hours. Urine analysis:    Component Value Date/Time   COLORURINE YELLOW (A) 03/14/2019 1913   APPEARANCEUR HAZY (A) 03/14/2019 1913   LABSPEC 1.026 03/14/2019 1913   PHURINE 5.0 03/14/2019 1913    GLUCOSEU >=500 (A) 03/14/2019 1913   HGBUR NEGATIVE 03/14/2019 1913   BILIRUBINUR NEGATIVE 03/14/2019 1913   KETONESUR 20 (A) 03/14/2019 1913   PROTEINUR NEGATIVE 03/14/2019 1913   NITRITE NEGATIVE 03/14/2019 1913   LEUKOCYTESUR NEGATIVE 03/14/2019 1913   Sepsis Labs: @LABRCNTIP (procalcitonin:4,lacticidven:4) ) Recent Results (from the past 240 hour(s))  SARS Coronavirus 2 by RT PCR (hospital order, performed in [facility] hospital lab) *cepheid single result test* Anterior Nasal Swab     Status: None   Collection Time: 04/22/22  3:53 AM   Specimen: Anterior Nasal Swab  Result Value Ref Range Status   SARS Coronavirus 2 by RT PCR NEGATIVE NEGATIVE Final    Comment: (NOTE) SARS-CoV-2 target nucleic acids are NOT DETECTED.  The SARS-CoV-2 RNA is generally detectable in upper and lower respiratory specimens during the acute phase of infection. The lowest concentration of SARS-CoV-2 viral copies this assay can detect is 250 copies / mL. A negative result does not preclude SARS-CoV-2 infection and should not be used as the sole basis for treatment or other patient management decisions.  A negative result may occur with improper specimen collection / handling, submission of specimen other than nasopharyngeal swab, presence of viral mutation(s) within the areas targeted by this assay, and inadequate number of viral copies (<250 copies / mL). A negative result must be combined with clinical observations, patient history, and epidemiological information.  Fact Sheet for Patients:   04/24/22  Fact Sheet for Healthcare Providers: RoadLapTop.co.za  This test is not yet approved or  cleared by the http://kim-miller.com/ FDA and has been authorized for detection and/or diagnosis of SARS-CoV-2 by FDA under an Emergency Use Authorization (EUA).  This EUA will remain in effect (meaning this test can be used) for the duration of  the COVID-19 declaration under Section 564(b)(1) of the Act, 21 U.S.C. section 360bbb-3(b)(1), unless the authorization is terminated or revoked sooner.  Performed at [facility], [address] [place], [place]   Blood culture (routine x 2)     Status: None (Preliminary result)   Collection Time: 04/22/22  4:32 AM   Specimen: BLOOD  Result Value Ref Range Status   Specimen Description BLOOD LEFT WRIST  Final   Special Requests   Final    BOTTLES DRAWN AEROBIC AND ANAEROBIC Blood Culture adequate volume   Culture   Final    NO GROWTH < 12 HOURS Performed at [facility], [address] [place], [place]    Report Status PENDING  Incomplete  Blood culture (routine x 2)  Status: None (Preliminary result)   Collection Time: 04/22/22  4:32 AM   Specimen: BLOOD  Result Value Ref Range Status   Specimen Description BLOOD RIGHT AC  Final   Special Requests   Final    BOTTLES DRAWN AEROBIC AND ANAEROBIC Blood Culture results may not be optimal due to an inadequate volume of blood received in culture bottles   Culture   Final    NO GROWTH < 12 HOURS Performed at [facility], [address], [place], [place] [address]    Report Status PENDING  Incomplete  Group A Strep by PCR     Status: None   Collection Time: 04/22/22 12:24 PM   Specimen: Throat; Sterile Swab  Result Value Ref Range Status   Group A Strep by PCR NOT DETECTED NOT DETECTED Final    Comment: Performed at [facility], [address], [place], [place] [address]     Radiological Exams on Admission: CT Head Wo Contrast  Result Date: 04/21/2022 CLINICAL DATA:  Chronic headaches EXAM: CT HEAD WITHOUT CONTRAST TECHNIQUE: Contiguous axial images were obtained from the base of the skull through the vertex without intravenous contrast. RADIATION DOSE REDUCTION: This exam was performed according to the departmental dose-optimization program which includes  automated exposure control, adjustment of the mA and/or kV according to patient size and/or use of iterative reconstruction technique. COMPARISON:  None Available. FINDINGS: Brain: No evidence of acute infarction, hemorrhage, hydrocephalus, extra-axial collection or mass lesion/mass effect. Vascular: No hyperdense vessel or unexpected calcification. Skull: Normal. Negative for fracture or focal lesion. Sinuses/Orbits: Orbits and their contents are within normal limits. Paranasal sinuses demonstrate multiple air-fluid levels within the ethmoid, sphenoid and maxillary sinuses consistent with acute sinusitis. Other: None IMPRESSION: No acute intracranial abnormality noted. Changes consistent with diffuse acute sinusitis. Electronically Signed   By: Alcide Clever M.D.   On: 04/21/2022 23:50   DG Chest 2 View  Result Date: 04/21/2022 CLINICAL DATA:  Shortness of breath. EXAM: CHEST - 2 VIEW COMPARISON:  None. FINDINGS: There is some minimal patchy airspace disease in the lingula. There is no evidence for pleural effusion or pneumothorax. Cardiomediastinal silhouette is within normal limits. There surgical clips in the right upper abdomen. No acute fractures are seen. IMPRESSION: Patchy airspace disease in the lingula worrisome for infection. Follow-up chest x-ray recommended in 4-6 weeks to confirm resolution. Electronically Signed   By: Darliss Cheney M.D.   On: 04/21/2022 21:06      Assessment/Plan Principal Problem:   CAP (community acquired pneumonia) Active Problems:   Hypertension   Diabetes mellitus without complication (HCC)   HLD (hyperlipidemia)   Depression   Obesity (BMI 30-39.9)   Assessment and Plan: * CAP (community acquired pneumonia) Patient has had 2 L new oxygen requirement.  Does not meet criteria for sepsis.  - Admit to tele bed as inpt - IV Rocephin and azithromycin - Mucinex for cough  - Bronchodilators - Urine legionella and S. pneumococcal antigen - f/u RVP - Follow up  blood culture x2, sputum culture and respiratory virus panel   Hypertension - IV hydralazine as needed -Lisinopril  Diabetes mellitus without complication (HCC) A1c 8.5 on 01/04/2018, poorly controlled.  Patient still taking Actos, Ozempic and metformin at home -Sliding scale insulin -Check A1c  HLD (hyperlipidemia) - Crestor  Depression - Continue home medications  Obesity (BMI 30-39.9)  BMI= 35.43  and BW= 90.7 -Diet and exercise.   -Encouraged to lose weight.  DVT ppx: SQ Lovenox  Code Status: Full code  Family Communication: not done, no family member is at bed side.    Disposition Plan:  Anticipate discharge back to previous environment  Consults called:  none  Admission status and Level of care: Telemetry Surgical:   as inpt         Dispo: The patient is from: Home              Anticipated d/c is to: Home              Anticipated d/c date is: 2 days              Patient currently is not medically stable to d/c.    Severity of Illness:  The appropriate patient status for this patient is INPATIENT. Inpatient status is judged to be reasonable and necessary in order to provide the required intensity of service to ensure the patient's safety. The patient's presenting symptoms, physical exam findings, and initial radiographic and laboratory data in the context of their chronic comorbidities is felt to place them at high risk for further clinical deterioration. Furthermore, it is not anticipated that the patient will be medically stable for discharge from the hospital within 2 midnights of admission.   * I certify that at the point of admission it is my clinical judgment that the patient will require inpatient hospital care spanning beyond 2 midnights from the point of admission due to high intensity of service, high risk for further deterioration and high frequency of surveillance required.*       Date of Service 04/22/2022    Lorretta HarpXilin  Rosha Cocker [facility]   If 7PM-7AM, please contact night-coverage [email or web address] 04/22/2022, 6:22 PM

## 2022-04-22 NOTE — Assessment & Plan Note (Signed)
-   Continue home medications 

## 2022-04-22 NOTE — Plan of Care (Signed)
  Problem: Education: Goal: Knowledge of General Education information will improve Description: Including pain rating scale, medication(s)/side effects and non-pharmacologic comfort measures Outcome: Progressing   Problem: Health Behavior/Discharge Planning: Goal: Ability to manage health-related needs will improve Outcome: Progressing   Problem: Clinical Measurements: Goal: Ability to maintain clinical measurements within normal limits will improve Outcome: Progressing Goal: Diagnostic test results will improve Outcome: Progressing Goal: Respiratory complications will improve Outcome: Progressing Goal: Cardiovascular complication will be avoided Outcome: Progressing   Problem: Activity: Goal: Risk for activity intolerance will decrease Outcome: Progressing   Problem: Nutrition: Goal: Adequate nutrition will be maintained Outcome: Progressing   Problem: Coping: Goal: Level of anxiety will decrease Outcome: Progressing   Problem: Elimination: Goal: Will not experience complications related to bowel motility Outcome: Progressing Goal: Will not experience complications related to urinary retention Outcome: Progressing   Problem: Pain Managment: Goal: General experience of comfort will improve Outcome: Progressing   Problem: Safety: Goal: Ability to remain free from injury will improve Outcome: Progressing   Problem: Skin Integrity: Goal: Risk for impaired skin integrity will decrease Outcome: Progressing   Problem: Activity: Goal: Ability to tolerate increased activity will improve Outcome: Progressing   Problem: Clinical Measurements: Goal: Ability to maintain a body temperature in the normal range will improve Outcome: Progressing   Problem: Respiratory: Goal: Ability to maintain adequate ventilation will improve Outcome: Progressing Goal: Ability to maintain a clear airway will improve Outcome: Progressing   Problem: Clinical Measurements: Goal: Will  remain free from infection Outcome: Not Progressing- Admitted for CAP

## 2022-04-22 NOTE — Progress Notes (Signed)
PHARMACIST - PHYSICIAN COMMUNICATION  CONCERNING:  Enoxaparin (Lovenox) for DVT Prophylaxis    RECOMMENDATION: Patient was prescribed enoxaprin 40mg  q24 hours for VTE prophylaxis.   Filed Weights   04/21/22 2020  Weight: 90.7 kg (200 lb)    Body mass index is 35.43 kg/m.  Estimated Creatinine Clearance: 90.9 mL/min (by C-G formula based on SCr of 0.66 mg/dL).   Based on [facility] policy patient is candidate for enoxaparin 0.5mg /kg TBW SQ every 24 hours based on BMI being >30.  DESCRIPTION: Pharmacy has adjusted enoxaparin dose per [facility] policy.  Patient is now receiving enoxaparin 0.5 mg/kg every 24 hours   [facility], PharmD, [facility] 04/22/2022 5:27 AM

## 2022-04-22 NOTE — Assessment & Plan Note (Signed)
-   IV hydralazine as needed -Lisinopril 

## 2022-04-22 NOTE — Assessment & Plan Note (Signed)
-   Crestor 

## 2022-04-22 NOTE — Plan of Care (Signed)

## 2022-04-22 NOTE — Assessment & Plan Note (Signed)
  BMI= 35.43  and BW= 90.7 -Diet and exercise.   -Encouraged to lose weight.

## 2022-04-22 NOTE — ED Notes (Signed)
Pt is A&Ox4. Pt's sore throat and chest tightness has progressively gotten worse through the week. Pt came in today due to having a headache completely encompassing her head. Pt says once she went on O2 in triage the headache cleared up.

## 2022-04-22 NOTE — ED Provider Notes (Signed)
[facility] Provider Note    Event Date/Time   First MD Initiated Contact with Patient 04/22/22 0259     (approximate)   History   Headache and Chest Pain   HPI  Andrea Shields is a 51 y.o. female who presents to the ED for evaluation of Headache and Chest Pain   I reviewed endocrine clinic visit from 5/9.  Obese patient with history of DM.   Patient presents to the ED, accompanied by her husband, for evaluation of cough, myalgias and headache.  She reports her son came home with a viral illness/cold a couple weeks ago.  Patient reports developing sore throat and headache initially, but developing and progressing into a productive cough over the past few days.  Reports malaise and myalgias diffusely without syncope, falls or trauma.  Reports some chest discomfort to her left-sided lower chest.    Physical Exam   Triage Vital Signs: ED Triage Vitals  Enc Vitals Group     BP 04/21/22 2023 137/64     Pulse Rate 04/21/22 2023 89     Resp 04/21/22 2023 (!) 24     Temp 04/21/22 2023 98.5 F (36.9 C)     Temp Source 04/21/22 2023 Oral     SpO2 04/21/22 2023 (!) 88 %     Weight 04/21/22 2020 200 lb (90.7 kg)     Height 04/21/22 2020 5\' 3"  (1.6 m)     Head Circumference --      Peak Flow --      Pain Score 04/21/22 2020 9     Pain Loc --      Pain Edu? --      Excl. in GC? --     Most recent vital signs: Vitals:   04/22/22 0310 04/22/22 0330  BP: 119/69 116/70  Pulse: 71 76  Resp: 19 14  Temp: 98.2 F (36.8 C)   SpO2: 99% 99%    General: Awake, no distress.  CV:  Good peripheral perfusion.  Resp:  Normal effort.  Abd:  No distention.  MSK:  No deformity noted.  Neuro:  No focal deficits appreciated. Other:     ED Results / Procedures / Treatments   Labs (all labs ordered are listed, but only abnormal results are displayed) Labs Reviewed  BASIC METABOLIC PANEL - Abnormal; Notable for the following components:      Result Value    Glucose, Bld 273 (*)    All other components within normal limits  CBC - Abnormal; Notable for the following components:   RBC 5.18 (*)    MCV 79.3 (*)    MCH 25.7 (*)    All other components within normal limits  CBG MONITORING, ED - Abnormal; Notable for the following components:   Glucose-Capillary 233 (*)    All other components within normal limits  SARS CORONAVIRUS 2 BY RT PCR  CULTURE, BLOOD (ROUTINE X 2)  CULTURE, BLOOD (ROUTINE X 2)  PROCALCITONIN  POC URINE PREG, ED  TROPONIN I (HIGH SENSITIVITY)  TROPONIN I (HIGH SENSITIVITY)    EKG Sinus rhythm with a rate of 87 bpm.  Normal axis and intervals.  No evidence of acute ischemia.  RADIOLOGY 2 view CXR interpreted by me with LLL opacity developing CT head interpreted by me without evidence of acute intracranial pathology  Official radiology report(s): CT Head Wo Contrast  Result Date: 04/21/2022 CLINICAL DATA:  Chronic headaches EXAM: CT HEAD WITHOUT CONTRAST TECHNIQUE: Contiguous axial images were obtained  from the base of the skull through the vertex without intravenous contrast. RADIATION DOSE REDUCTION: This exam was performed according to the departmental dose-optimization program which includes automated exposure control, adjustment of the mA and/or kV according to patient size and/or use of iterative reconstruction technique. COMPARISON:  None Available. FINDINGS: Brain: No evidence of acute infarction, hemorrhage, hydrocephalus, extra-axial collection or mass lesion/mass effect. Vascular: No hyperdense vessel or unexpected calcification. Skull: Normal. Negative for fracture or focal lesion. Sinuses/Orbits: Orbits and their contents are within normal limits. Paranasal sinuses demonstrate multiple air-fluid levels within the ethmoid, sphenoid and maxillary sinuses consistent with acute sinusitis. Other: None IMPRESSION: No acute intracranial abnormality noted. Changes consistent with diffuse acute sinusitis. Electronically  Signed   By: Alcide Clever M.D.   On: 04/21/2022 23:50   DG Chest 2 View  Result Date: 04/21/2022 CLINICAL DATA:  Shortness of breath. EXAM: CHEST - 2 VIEW COMPARISON:  None. FINDINGS: There is some minimal patchy airspace disease in the lingula. There is no evidence for pleural effusion or pneumothorax. Cardiomediastinal silhouette is within normal limits. There surgical clips in the right upper abdomen. No acute fractures are seen. IMPRESSION: Patchy airspace disease in the lingula worrisome for infection. Follow-up chest x-ray recommended in 4-6 weeks to confirm resolution. Electronically Signed   By: Darliss Cheney M.D.   On: 04/21/2022 21:06    PROCEDURES and INTERVENTIONS:  .1-3 Lead EKG Interpretation  Performed by: Delton Prairie, MD Authorized by: Delton Prairie, MD     Interpretation: normal     ECG rate:  78   ECG rate assessment: normal     Rhythm: sinus rhythm     Ectopy: none     Conduction: normal   .Critical Care  Performed by: Delton Prairie, MD Authorized by: Delton Prairie, MD   Critical care provider statement:    Critical care time (minutes):  30   Critical care time was exclusive of:  Separately billable procedures and treating other patients   Critical care was necessary to treat or prevent imminent or life-threatening deterioration of the following conditions:  Respiratory failure   Critical care was time spent personally by me on the following activities:  Development of treatment plan with patient or surrogate, discussions with consultants, evaluation of patient's response to treatment, examination of patient, ordering and review of laboratory studies, ordering and review of radiographic studies, ordering and performing treatments and interventions, pulse oximetry, re-evaluation of patient's condition and review of old charts   Medications  cefTRIAXone (ROCEPHIN) 1 g in sodium chloride 0.9 % 100 mL IVPB (1 g Intravenous New Bag/Given 04/22/22 0434)  azithromycin  (ZITHROMAX) 500 mg in sodium chloride 0.9 % 250 mL IVPB (has no administration in time range)     IMPRESSION / MDM / ASSESSMENT AND PLAN / ED COURSE  I reviewed the triage vital signs and the nursing notes.  Differential diagnosis includes, but is not limited to, ACS, pneumonia, pneumothorax, DKA, viral syndrome, sepsis  {Patient presents with symptoms of an acute illness or injury that is potentially life-threatening.  Pleasant 51 year old diabetic female presents to the ED with evidence of community-acquired pneumonia with associated hypoxia, requiring medical admission.  She is hypoxic requiring nasal cannula, but remains hemodynamically stable otherwise.  No signs of sepsis.  Essentially normal CBC.  Metabolic panel with mild hyperglycemia without stigmata of acidosis.  Negative troponin and COVID test.  CXR with LLL opacity.  Due to her concurrent hypoxia, we will consult with medicine for admission.  Cultures  drawn prior to CAP coverage provision      FINAL CLINICAL IMPRESSION(S) / ED DIAGNOSES   Final diagnoses:  Community acquired pneumonia of left lower lobe of lung  Hypoxia     Rx / DC Orders   ED Discharge Orders     None        Note:  This document was prepared using Dragon voice recognition software and may include unintentional dictation errors.   Delton Prairie, MD 04/22/22 [phone]

## 2022-04-23 DIAGNOSIS — E119 Type 2 diabetes mellitus without complications: Secondary | ICD-10-CM | POA: Diagnosis not present

## 2022-04-23 DIAGNOSIS — J189 Pneumonia, unspecified organism: Secondary | ICD-10-CM | POA: Diagnosis not present

## 2022-04-23 DIAGNOSIS — B348 Other viral infections of unspecified site: Secondary | ICD-10-CM | POA: Diagnosis present

## 2022-04-23 LAB — CBC
HCT: 34.3 % — ABNORMAL LOW (ref 36.0–46.0)
Hemoglobin: 11.2 g/dL — ABNORMAL LOW (ref 12.0–15.0)
MCH: 25.7 pg — ABNORMAL LOW (ref 26.0–34.0)
MCHC: 32.7 g/dL (ref 30.0–36.0)
MCV: 78.7 fL — ABNORMAL LOW (ref 80.0–100.0)
Platelets: 261 10*3/uL (ref 150–400)
RBC: 4.36 MIL/uL (ref 3.87–5.11)
RDW: 12.6 % (ref 11.5–15.5)
WBC: 7.7 10*3/uL (ref 4.0–10.5)
nRBC: 0 % (ref 0.0–0.2)

## 2022-04-23 LAB — RESPIRATORY PANEL BY PCR

## 2022-04-23 LAB — BASIC METABOLIC PANEL
Anion gap: 8 (ref 5–15)
BUN: 7 mg/dL (ref 6–20)
CO2: 25 mmol/L (ref 22–32)
Calcium: 8.9 mg/dL (ref 8.9–10.3)
Chloride: 104 mmol/L (ref 98–111)
Creatinine, Ser: 0.61 mg/dL (ref 0.44–1.00)
GFR, Estimated: >60 mL/min (ref >60)
Glucose, Bld: 200 mg/dL — ABNORMAL HIGH (ref 70–99)
Potassium: 3.6 mmol/L (ref 3.5–5.1)
Sodium: 137 mmol/L (ref 135–145)

## 2022-04-23 LAB — GLUCOSE, CAPILLARY
Glucose-Capillary: 178 mg/dL — ABNORMAL HIGH (ref 70–99)
Glucose-Capillary: 203 mg/dL — ABNORMAL HIGH (ref 70–99)
Glucose-Capillary: 216 mg/dL — ABNORMAL HIGH (ref 70–99)
Glucose-Capillary: 234 mg/dL — ABNORMAL HIGH (ref 70–99)

## 2022-04-23 LAB — EXPECTORATED SPUTUM ASSESSMENT W GRAM STAIN, RFLX TO RESP C

## 2022-04-23 MED ORDER — IBUPROFEN 400 MG PO TABS
400.0000 mg | ORAL_TABLET | Freq: Four times a day (QID) | ORAL | Status: DC | PRN
  Administered 2022-04-23 – 2022-04-24 (×3): 400 mg via ORAL
  Filled 2022-04-23 (×3): qty 1

## 2022-04-23 MED ORDER — FLUTICASONE PROPIONATE 50 MCG/ACT NA SUSP
2.0000 | Freq: Every day | NASAL | Status: DC
  Administered 2022-04-23 – 2022-04-24 (×2): 2 via NASAL
  Filled 2022-04-23: qty 16

## 2022-04-23 MED ORDER — AZITHROMYCIN 500 MG PO TABS
500.0000 mg | ORAL_TABLET | Freq: Every day | ORAL | Status: DC
  Administered 2022-04-24: 500 mg via ORAL
  Filled 2022-04-23: qty 1

## 2022-04-23 MED ORDER — NAPHAZOLINE-GLYCERIN 0.012-0.25 % OP SOLN
2.0000 [drp] | Freq: Four times a day (QID) | OPHTHALMIC | Status: DC | PRN
  Administered 2022-04-23: 2 [drp] via OPHTHALMIC
  Filled 2022-04-23: qty 15

## 2022-04-23 MED ORDER — IPRATROPIUM-ALBUTEROL 0.5-2.5 (3) MG/3ML IN SOLN
3.0000 mL | Freq: Two times a day (BID) | RESPIRATORY_TRACT | Status: DC
  Administered 2022-04-23 – 2022-04-24 (×2): 3 mL via RESPIRATORY_TRACT
  Filled 2022-04-23 (×2): qty 3

## 2022-04-23 NOTE — Plan of Care (Signed)

## 2022-04-23 NOTE — Progress Notes (Addendum)
Progress Note   Patient: Andrea Shields UKG:[id number] DOB: Aug 28, 1971 DOA: 04/22/2022     1 DOS: the patient was seen and examined on 04/23/2022   Brief hospital course: HPI on Admission: "Rogenia Werntz is a 51 y.o. female with medical history significant of hypertension, hyperlipidemia, diabetes mellitus, GERD, depression, obesity obesity BMI 35.43, who presents with shortness of breath.   Patient states that she has shortness breath for more than 6 days.  She has productive cough and chest tightness.  No fever or chills.  Patient initially had sore throat, which has resolved.  Patient also has headache.  Pt has bee using nebulizer and inhaler at home without significant improvement.  Patient does not have nausea, vomiting, diarrhea or abdominal pain.  No symptoms of UTI.  Patient normally is not using oxygen at home.  Oxygen desaturation to 88% on room air in ED, which improved to 97% on 2 L oxygen.   Data reviewed independently and ED Course: pt was found to have WBC 9.3, troponin level 3, 4, negative COVID PCR, negative rapid strep PCR, GFR> 60, temperature normal, blood pressure 131/69, heart rate 89, RR 24.  Chest x-ray showed patchy infiltration in lingular lobe.  CT head negative for acute intracranial abnormalities, but showed possible sinusitis.  Patient is admitted to telemetry bed as inpatient."   8/27: pt improving, remains with severe headache and congestion, off O2 at rest but has not been up walking yet    Assessment and Plan: * CAP (community acquired pneumonia) Presented with shortness of breath and initially required 2 L new oxygen requirement.   Did not meet SIRS criteria, not septic. Respiratory viral panel positive for rhinovirus. --Continue Rocephin and Zithromax in case of secondary bacterial infection developing given pneumonia seen on imaging --Supportive care with Mucinex for cough, bronchodilators --Follow cultures --Monitor O2, supplement to maintain sats above 90%  if needed --Check ambulatory O2 sats   Rhinovirus infection Past showed rhinovirus infection.  Chest x-ray did have pneumonia in the lingula.  Continue antibiotics for secondary bacterial pneumonia coverage.  Supportive care.  Hypertension - IV hydralazine as needed -Lisinopril  Diabetes mellitus without complication (HCC) A1c 8.5 on 01/04/2018, poorly controlled.  Patient still taking Actos, Ozempic and metformin at home -Home regimen on hold. Repeat A1c 8.6% --Sliding scale NovoLog   HLD (hyperlipidemia) - Crestor  Depression - Continue home medications  Obesity (BMI 30-39.9) BMI= 35.43  and BW= 90.7 -Diet and exercise.   -Encouraged to lose weight.         Subjective: Patient awake sitting up in bed, husband at bedside when seen on rounds this morning.  She reports ongoing severe sinus headache and congestion.  She is not typically prone to headaches and it is severe, made worse by coughing, denies fevers chills.  Has not been up walking much but is off oxygen at rest.  Physical Exam: Vitals:   04/22/22 1959 04/23/22 0512 04/23/22 0729 04/23/22 1612  BP: 118/68 131/68 127/64 117/64  Pulse: 86 79 79 82  Resp: 20 20 17 16   Temp: 98.8 F (37.1 C) 98.3 F (36.8 C) 97.9 F (36.6 C) 98.6 F (37 C)  TempSrc:      SpO2: 97% 95% 98% 98%  Weight:      Height:       General exam: awake, alert, no acute distress, mildly ill-appearing HEENT: moist mucus membranes, hearing grossly normal, sounds congested Respiratory system: Diminished breath sounds, normal respiratory effort at rest. Cardiovascular system: RRR, no  pedal edema.   Gastrointestinal system: soft, NT, ND Central nervous system: A&O x4. no gross focal neurologic deficits, normal speech Extremities: moves all, no edema, normal tone Skin: dry, intact, normal temperature Psychiatry: normal mood, congruent affect, judgement and insight appear normal   Data Reviewed:  Notable labs --respiratory viral panel  positive for rhinovirus.  BMP with glucose 200 otherwise normal.  Hemoglobin 11.2 otherwise normal CBC  Family Communication: Husband at bedside on rounds  Disposition: Status is: Inpatient Remains inpatient appropriate because: Severity of illness with ongoing severe headache, monitoring O2 sats off oxygen closely.  Expect discharge tomorrow if further improvement.   Planned Discharge Destination: Home    Time spent: 40 minutes  Author: Pennie Banter, DO 04/23/2022 6:31 PM  For on call review [email or web address].

## 2022-04-23 NOTE — Progress Notes (Signed)
Pt ambulated around nursing station while on O2 sensor. Oxygen saturation remained steady at 100%. Patient does not report shortness of breath or dizziness  Task done at 2110

## 2022-04-23 NOTE — Progress Notes (Signed)
PHARMACIST - PHYSICIAN COMMUNICATION  CONCERNING: Antibiotic IV to Oral Route Change Policy  RECOMMENDATION: This patient is receiving azithromycin by the intravenous route.  Based on criteria approved by the Pharmacy and Therapeutics Committee, the antibiotic(s) is/are being converted to the equivalent oral dose form(s).   DESCRIPTION: These criteria include: Patient being treated for a respiratory tract infection, urinary tract infection, cellulitis or clostridium difficile associated diarrhea if on metronidazole The patient is not neutropenic and does not exhibit a GI malabsorption state The patient is eating (either orally or via tube) and/or has been taking other orally administered medications for a least 24 hours The patient is improving clinically and has a Tmax < 100.5  If you have questions about this conversion, please contact the Pharmacy Department   Andrea Shields  04/23/22    

## 2022-04-23 NOTE — Assessment & Plan Note (Signed)
Past showed rhinovirus infection.  Chest x-ray did have pneumonia in the lingula.  Continue antibiotics for secondary bacterial pneumonia coverage.  Supportive care.

## 2022-04-23 NOTE — Hospital Course (Addendum)
HPI on Admission: "Andrea Shields is a 51 y.o. female with medical history significant of hypertension, hyperlipidemia, diabetes mellitus, GERD, depression, obesity obesity BMI 35.43, who presents with shortness of breath.   Patient states that she has shortness breath for more than 6 days.  She has productive cough and chest tightness.  No fever or chills.  Patient initially had sore throat, which has resolved.  Patient also has headache.  Pt has bee using nebulizer and inhaler at home without significant improvement.  Patient does not have nausea, vomiting, diarrhea or abdominal pain.  No symptoms of UTI.  Patient normally is not using oxygen at home.  Oxygen desaturation to 88% on room air in ED, which improved to 97% on 2 L oxygen.   Data reviewed independently and ED Course: pt was found to have WBC 9.3, troponin level 3, 4, negative COVID PCR, negative rapid strep PCR, GFR> 60, temperature normal, blood pressure 131/69, heart rate 89, RR 24.  Chest x-ray showed patchy infiltration in lingular lobe.  CT head negative for acute intracranial abnormalities, but showed possible sinusitis.  Patient is admitted to telemetry bed as inpatient."   8/28: Pt improved, congestions improving, no fevers, no significant dyspnea on exertion or O2 desaturations with ambulation.  Stable for discharge home with close PCP follow up.

## 2022-04-24 ENCOUNTER — Other Ambulatory Visit: Payer: Self-pay | Admitting: Internal Medicine

## 2022-04-24 LAB — LEGIONELLA PNEUMOPHILA SEROGP 1 UR AG: L. pneumophila Serogp 1 Ur Ag: NEGATIVE

## 2022-04-24 LAB — GLUCOSE, CAPILLARY: Glucose-Capillary: 206 mg/dL — ABNORMAL HIGH (ref 70–99)

## 2022-04-24 MED ORDER — DM-GUAIFENESIN ER 30-600 MG PO TB12: 1.0000 | ORAL_TABLET | Freq: Two times a day (BID) | ORAL | 0 refills | Status: AC | PRN

## 2022-04-24 MED ORDER — AZITHROMYCIN 500 MG PO TABS: 500.0000 mg | ORAL_TABLET | Freq: Every day | ORAL | 0 refills | Status: AC

## 2022-04-24 MED ORDER — CEFDINIR 300 MG PO CAPS: 300.0000 mg | ORAL_CAPSULE | Freq: Two times a day (BID) | ORAL | 0 refills | Status: AC

## 2022-04-24 MED ORDER — IBUPROFEN 400 MG PO TABS
400.0000 mg | ORAL_TABLET | Freq: Four times a day (QID) | ORAL | 0 refills | Status: AC | PRN
Start: 2022-04-24 — End: ?

## 2022-04-24 NOTE — Plan of Care (Signed)
  Problem: Education: Goal: Knowledge of General Education information will improve Description: Including pain rating scale, medication(s)/side effects and non-pharmacologic comfort measures Outcome: Adequate for Discharge   Problem: Health Behavior/Discharge Planning: Goal: Ability to manage health-related needs will improve Outcome: Adequate for Discharge   Problem: Clinical Measurements: Goal: Ability to maintain clinical measurements within normal limits will improve Outcome: Adequate for Discharge Goal: Will remain free from infection Outcome: Adequate for Discharge Goal: Diagnostic test results will improve Outcome: Adequate for Discharge Goal: Respiratory complications will improve Outcome: Adequate for Discharge Goal: Cardiovascular complication will be avoided Outcome: Adequate for Discharge   Problem: Activity: Goal: Risk for activity intolerance will decrease Outcome: Adequate for Discharge   Problem: Nutrition: Goal: Adequate nutrition will be maintained Outcome: Adequate for Discharge   Problem: Coping: Goal: Level of anxiety will decrease Outcome: Adequate for Discharge   Problem: Elimination: Goal: Will not experience complications related to bowel motility Outcome: Adequate for Discharge Goal: Will not experience complications related to urinary retention Outcome: Adequate for Discharge   Problem: Pain Managment: Goal: General experience of comfort will improve Outcome: Adequate for Discharge   Problem: Safety: Goal: Ability to remain free from injury will improve Outcome: Adequate for Discharge   Problem: Skin Integrity: Goal: Risk for impaired skin integrity will decrease Outcome: Adequate for Discharge   Problem: Activity: Goal: Ability to tolerate increased activity will improve Outcome: Adequate for Discharge   Problem: Clinical Measurements: Goal: Ability to maintain a body temperature in the normal range will improve Outcome: Adequate  for Discharge   Problem: Respiratory: Goal: Ability to maintain adequate ventilation will improve Outcome: Adequate for Discharge Goal: Ability to maintain a clear airway will improve Outcome: Adequate for Discharge   Problem: Education: Goal: Ability to describe self-care measures that may prevent or decrease complications (Diabetes Survival Skills Education) will improve Outcome: Adequate for Discharge Goal: Individualized Educational Video(s) Outcome: Adequate for Discharge   Problem: Coping: Goal: Ability to adjust to condition or change in health will improve Outcome: Adequate for Discharge   Problem: Fluid Volume: Goal: Ability to maintain a balanced intake and output will improve Outcome: Adequate for Discharge   Problem: Health Behavior/Discharge Planning: Goal: Ability to identify and utilize available resources and services will improve Outcome: Adequate for Discharge Goal: Ability to manage health-related needs will improve Outcome: Adequate for Discharge   Problem: Metabolic: Goal: Ability to maintain appropriate glucose levels will improve Outcome: Adequate for Discharge   Problem: Nutritional: Goal: Maintenance of adequate nutrition will improve Outcome: Adequate for Discharge Goal: Progress toward achieving an optimal weight will improve Outcome: Adequate for Discharge   Problem: Skin Integrity: Goal: Risk for impaired skin integrity will decrease Outcome: Adequate for Discharge   Problem: Tissue Perfusion: Goal: Adequacy of tissue perfusion will improve Outcome: Adequate for Discharge   

## 2022-04-24 NOTE — Progress Notes (Signed)
Patient's sputum culture showing GPC's in pairs and clusters as well as GPR's.  Discharged only on Zithromax. Will send Rx Omnicef to continue as well.

## 2022-04-24 NOTE — Discharge Summary (Signed)
Physician Discharge Summary   Patient: Andrea Shields MRN: [id number] DOB: [birth year]  Admit date:     04/22/2022  Discharge date: 04/24/2022  Discharge Physician: Pennie Banter   PCP: Jacklynn Bue, MD   Recommendations at discharge:    Follow up with Primary Care in 1-2 weeks Repeat CBC, BMP, Mg in 1-2 weeks  Discharge Diagnoses: Principal Problem:   CAP (community acquired pneumonia) Active Problems:   Rhinovirus infection   Hypertension   Diabetes mellitus without complication (HCC)   HLD (hyperlipidemia)   Depression   Obesity (BMI 30-39.9)  Resolved Problems:   * No resolved hospital problems. *  Hospital Course: HPI on Admission: "Andrea Shields is a 51 y.o. female with medical history significant of hypertension, hyperlipidemia, diabetes mellitus, GERD, depression, obesity obesity BMI 35.43, who presents with shortness of breath.   Patient states that she has shortness breath for more than 6 days.  She has productive cough and chest tightness.  No fever or chills.  Patient initially had sore throat, which has resolved.  Patient also has headache.  Pt has bee using nebulizer and inhaler at home without significant improvement.  Patient does not have nausea, vomiting, diarrhea or abdominal pain.  No symptoms of UTI.  Patient normally is not using oxygen at home.  Oxygen desaturation to 88% on room air in ED, which improved to 97% on 2 L oxygen.   Data reviewed independently and ED Course: pt was found to have WBC 9.3, troponin level 3, 4, negative COVID PCR, negative rapid strep PCR, GFR> 60, temperature normal, blood pressure 131/69, heart rate 89, RR 24.  Chest x-ray showed patchy infiltration in lingular lobe.  CT head negative for acute intracranial abnormalities, but showed possible sinusitis.  Patient is admitted to telemetry bed as inpatient."   8/28: Pt improved, congestions improving, no fevers, no significant dyspnea on exertion or O2 desaturations with ambulation.   Stable for discharge home with close PCP follow up.   Assessment and Plan: * CAP (community acquired pneumonia) Presented with shortness of breath and initially required 2 L new oxygen requirement.   Did not meet SIRS criteria, not septic. Respiratory viral panel positive for rhinovirus. Suspect secondary bacterial pneumonia superimposed on viral infection. --Treated with empiric Rocephin and Zithromax --Discharged on PO Zithromax and Omnicef since sputum cultures with prelim bacterial findings are still pending. --Supportive care with Mucinex for cough, bronchodilators --Ambulating well without need for supplemental O2 --PCP follow up in 1-2 weeks  Rhinovirus infection Past showed rhinovirus infection.  Chest x-ray did have pneumonia in the lingula.  Continue antibiotics for secondary bacterial pneumonia coverage.  Supportive care.  Hypertension - IV hydralazine as needed -Lisinopril  Diabetes mellitus without complication (HCC) A1c 8.5 on 01/04/2018, poorly controlled.   Repeat A1c 8.6% Resume home regimen at discharge. Covered with sliding scale NovoLog during admission.   HLD (hyperlipidemia) - Crestor  Depression - Continue home medications  Obesity (BMI 30-39.9) BMI= 35.43  and BW= 90.7 -Diet and exercise.   -Encouraged to lose weight.          Consultants: none Procedures performed: none  Disposition: Home Diet recommendation:  Carb modified diet DISCHARGE MEDICATION: Allergies as of 04/24/2022       Reactions   Iodinated Contrast Media Anaphylaxis   Shellfish-derived Products Anaphylaxis   Sulfa Antibiotics Hives   Other    Pneumonia vaccine caused arm swelling   Shellfish Allergy    Sitagliptin Nausea And Vomiting  Medication List     STOP taking these medications    Jardiance 25 MG Tabs tablet Generic drug: empagliflozin       TAKE these medications    albuterol 108 (90 Base) MCG/ACT inhaler Commonly known as: VENTOLIN  HFA Inhale into the lungs.   albuterol (2.5 MG/3ML) 0.083% nebulizer solution Commonly known as: PROVENTIL Take 2.5 mg by nebulization every 4 (four) hours as needed.   budesonide-formoterol 80-4.5 MCG/ACT inhaler Commonly known as: SYMBICORT SMARTSIG:By Mouth   cetirizine 10 MG tablet Commonly known as: ZYRTEC Take 10 mg by mouth daily.   citalopram 10 MG tablet Commonly known as: CELEXA Take 10 mg by mouth daily.   dextromethorphan-guaiFENesin 30-600 MG 12hr tablet Commonly known as: MUCINEX DM Take 1 tablet by mouth 2 (two) times daily as needed for up to 10 days for cough.   famotidine 40 MG tablet Commonly known as: PEPCID Take 40 mg by mouth daily.   ibuprofen 400 MG tablet Commonly known as: ADVIL Take 1 tablet (400 mg total) by mouth every 6 (six) hours as needed for fever, headache or mild pain.   lisinopril 10 MG tablet Commonly known as: ZESTRIL Take 10 mg by mouth daily.   metFORMIN 500 MG 24 hr tablet Commonly known as: GLUCOPHAGE-XR Take 1,000 mg by mouth 2 (two) times daily.   montelukast 10 MG tablet Commonly known as: SINGULAIR Take 10 mg by mouth daily.   Ozempic (2 MG/DOSE) 8 MG/3ML Sopn Generic drug: Semaglutide (2 MG/DOSE) Inject into the skin.   pantoprazole 20 MG tablet Commonly known as: PROTONIX Take 20 mg by mouth daily.   pioglitazone 30 MG tablet Commonly known as: ACTOS Take 30 mg by mouth daily.   rosuvastatin 5 MG tablet Commonly known as: CRESTOR Take 5 mg by mouth daily.       ASK your doctor about these medications    azithromycin 500 MG tablet Commonly known as: Zithromax Take 1 tablet (500 mg total) by mouth daily for 3 days. Take 1 tablet daily for 3 days. Ask about: Should I take this medication?        Discharge Exam: Filed Weights   04/21/22 2020  Weight: 90.7 kg   General exam: awake, alert, no acute distress HEENT: atraumatic, clear conjunctiva, anicteric sclera, moist mucus membranes, hearing  grossly normal  Respiratory system: CTAB, no wheezes, rales or rhonchi, normal respiratory effort. Cardiovascular system: normal S1/S2, RRR, no JVD, murmurs, rubs, gallops, no pedal edema.   Gastrointestinal system: soft, NT, ND, no HSM felt, +bowel sounds. Central nervous system: A&O x3. no gross focal neurologic deficits, normal speech Extremities: moves all, no edema, normal tone Skin: dry, intact, normal temperature Psychiatry: normal mood, congruent affect, judgement and insight appear normal   Condition at discharge: stable  The results of significant diagnostics from this hospitalization (including imaging, microbiology, ancillary and laboratory) are listed below for reference.   Imaging Studies: CT Head Wo Contrast  Result Date: 04/21/2022 CLINICAL DATA:  Chronic headaches EXAM: CT HEAD WITHOUT CONTRAST TECHNIQUE: Contiguous axial images were obtained from the base of the skull through the vertex without intravenous contrast. RADIATION DOSE REDUCTION: This exam was performed according to the departmental dose-optimization program which includes automated exposure control, adjustment of the mA and/or kV according to patient size and/or use of iterative reconstruction technique. COMPARISON:  None Available. FINDINGS: Brain: No evidence of acute infarction, hemorrhage, hydrocephalus, extra-axial collection or mass lesion/mass effect. Vascular: No hyperdense vessel or unexpected calcification. Skull: Normal. Negative  for fracture or focal lesion. Sinuses/Orbits: Orbits and their contents are within normal limits. Paranasal sinuses demonstrate multiple air-fluid levels within the ethmoid, sphenoid and maxillary sinuses consistent with acute sinusitis. Other: None IMPRESSION: No acute intracranial abnormality noted. Changes consistent with diffuse acute sinusitis. Electronically Signed   By: Alcide Clever M.D.   On: 04/21/2022 23:50   DG Chest 2 View  Result Date: 04/21/2022 CLINICAL DATA:   Shortness of breath. EXAM: CHEST - 2 VIEW COMPARISON:  None. FINDINGS: There is some minimal patchy airspace disease in the lingula. There is no evidence for pleural effusion or pneumothorax. Cardiomediastinal silhouette is within normal limits. There surgical clips in the right upper abdomen. No acute fractures are seen. IMPRESSION: Patchy airspace disease in the lingula worrisome for infection. Follow-up chest x-ray recommended in 4-6 weeks to confirm resolution. Electronically Signed   By: Darliss Cheney M.D.   On: 04/21/2022 21:06    Microbiology: Results for orders placed or performed during the hospital encounter of 04/22/22  Expectorated Sputum Assessment w Gram Stain, Rflx to Resp Cult     Status: None   Collection Time: 04/22/22  1:02 AM   Specimen: Sputum  Result Value Ref Range Status   Specimen Description SPUTUM  Final   Special Requests NONE  Final   Sputum evaluation   Final    THIS SPECIMEN IS ACCEPTABLE FOR SPUTUM CULTURE Performed at [facility], [address] [place], [place] [address]    Report Status 04/23/2022 FINAL  Final  Culture, Respiratory w Gram Stain     Status: None   Collection Time: 04/22/22  1:02 AM   Specimen: SPU  Result Value Ref Range Status   Specimen Description   Final    SPUTUM Performed at [facility], [address], [place], [place] [address]    Special Requests   Final    NONE Reflexed from [id number] Performed at [facility], [address] [place], [place] [address]    Gram Stain   Final    FEW GRAM POSITIVE COCCI IN PAIRS IN CHAINS GRAM POSITIVE RODS MODERATE WBC PRESENT, PREDOMINANTLY PMN RARE YEAST    Culture   Final    FEW Normal respiratory flora-no Staph aureus or Pseudomonas seen Performed at [facility], [address], [place], [place] [address]    Report Status 04/25/2022 FINAL  Final  SARS Coronavirus 2 by RT PCR (hospital order, performed in [facility] hospital lab) *cepheid single  result test* Anterior Nasal Swab     Status: None   Collection Time: 04/22/22  3:53 AM   Specimen: Anterior Nasal Swab  Result Value Ref Range Status   SARS Coronavirus 2 by RT PCR NEGATIVE NEGATIVE Final    Comment: (NOTE) SARS-CoV-2 target nucleic acids are NOT DETECTED.  The SARS-CoV-2 RNA is generally detectable in upper and lower respiratory specimens during the acute phase of infection. The lowest concentration of SARS-CoV-2 viral copies this assay can detect is 250 copies / mL. A negative result does not preclude SARS-CoV-2 infection and should not be used as the sole basis for treatment or other patient management decisions.  A negative result may occur with improper specimen collection / handling, submission of specimen other than nasopharyngeal swab, presence of viral mutation(s) within the areas targeted by this assay, and inadequate number of viral copies (<250 copies / mL). A negative result must be combined with clinical observations, patient history, and epidemiological information.  Fact Sheet for Patients:  RoadLapTop.co.za  Fact Sheet for Healthcare Providers: http://kim-miller.com/  This test is not yet approved or  cleared by the [place] FDA and has been authorized for detection and/or diagnosis of SARS-CoV-2 by FDA under an Emergency Use Authorization (EUA).  This EUA will remain in effect (meaning this test can be used) for the duration of the COVID-19 declaration under Section 564(b)(1) of the Act, 21 U.S.C. section 360bbb-3(b)(1), unless the authorization is terminated or revoked sooner.  Performed at [facility], [address] [place], [place] [address]   Blood culture (routine x 2)     Status: None   Collection Time: 04/22/22  4:32 AM   Specimen: BLOOD  Result Value Ref Range Status   Specimen Description BLOOD LEFT WRIST  Final   Special Requests   Final    BOTTLES DRAWN AEROBIC AND  ANAEROBIC Blood Culture adequate volume   Culture   Final    NO GROWTH 5 DAYS Performed at [facility], [address] [place], [place] [address]    Report Status 04/27/2022 FINAL  Final  Blood culture (routine x 2)     Status: None   Collection Time: 04/22/22  4:32 AM   Specimen: BLOOD  Result Value Ref Range Status   Specimen Description BLOOD RIGHT [facility]  Final   Special Requests   Final    BOTTLES DRAWN AEROBIC AND ANAEROBIC Blood Culture results may not be optimal due to an inadequate volume of blood received in culture bottles   Culture   Final    NO GROWTH 5 DAYS Performed at [facility], [address] [place], [place] [address]    Report Status 04/27/2022 FINAL  Final  Group A Strep by PCR     Status: None   Collection Time: 04/22/22 12:24 PM   Specimen: Throat; Sterile Swab  Result Value Ref Range Status   Group A Strep by PCR NOT DETECTED NOT DETECTED Final    Comment: Performed at [facility], [address] [place], [place] [address]  Respiratory (~20 pathogens) panel by PCR     Status: Abnormal   Collection Time: 04/22/22  2:50 PM  Result Value Ref Range Status   Adenovirus NOT DETECTED NOT DETECTED Final   Coronavirus 229E NOT DETECTED NOT DETECTED Final    Comment: (NOTE) The Coronavirus on the Respiratory Panel, DOES NOT test for the novel  Coronavirus (2019 nCoV)    Coronavirus HKU1 NOT DETECTED NOT DETECTED Final   Coronavirus NL63 NOT DETECTED NOT DETECTED Final   Coronavirus OC43 NOT DETECTED NOT DETECTED Final   Metapneumovirus NOT DETECTED NOT DETECTED Final   Rhinovirus / Enterovirus DETECTED (A) NOT DETECTED Final   Influenza A NOT DETECTED NOT DETECTED Final   Influenza B NOT DETECTED NOT DETECTED Final   Parainfluenza Virus 1 NOT DETECTED NOT DETECTED Final   Parainfluenza Virus 2 NOT DETECTED NOT DETECTED Final   Parainfluenza Virus 3 NOT DETECTED NOT DETECTED Final   Parainfluenza Virus 4 NOT DETECTED NOT  DETECTED Final   Respiratory Syncytial Virus NOT DETECTED NOT DETECTED Final   Bordetella pertussis NOT DETECTED NOT DETECTED Final   Bordetella Parapertussis NOT DETECTED NOT DETECTED Final   Chlamydophila pneumoniae NOT DETECTED NOT DETECTED Final   Mycoplasma pneumoniae NOT DETECTED NOT DETECTED Final    Comment: Performed at [facility], [address], [place], [place] [address]    Labs: CBC: No results for input(s): "WBC", "NEUTROABS", "HGB", "HCT", "MCV", "PLT" in  the last 168 hours.  Basic Metabolic Panel: No results for input(s): "NA", "K", "CL", "CO2", "GLUCOSE", "BUN", "CREATININE", "CALCIUM", "MG", "PHOS" in the last 168 hours.  Liver Function Tests: No results for input(s): "AST", "ALT", "ALKPHOS", "BILITOT", "PROT", "ALBUMIN" in the last 168 hours. CBG: No results for input(s): "GLUCAP" in the last 168 hours.   Discharge time spent: less than 30 minutes.  Signed: Pennie BanterKelly A Alaena Strader, DO [facility] 05/02/2022

## 2022-04-24 NOTE — Progress Notes (Signed)
Patient discharging home, instructions given to patient, verbalized understanding. Husband to transport patient home.

## 2022-04-25 LAB — CULTURE, RESPIRATORY W GRAM STAIN: Culture: NORMAL

## 2022-04-27 LAB — CULTURE, BLOOD (ROUTINE X 2)
Culture: NO GROWTH
Culture: NO GROWTH
Special Requests: ADEQUATE

## 2023-06-26 ENCOUNTER — Other Ambulatory Visit: Payer: Self-pay

## 2023-06-26 ENCOUNTER — Ambulatory Visit
Admission: RE | Admit: 2023-06-26 | Discharge: 2023-06-26 | Disposition: A | Discharge status: Home / Self Care | Payer: Federal, State, Local not specified - PPO | Attending: *Deleted | Admitting: *Deleted

## 2023-06-26 ENCOUNTER — Ambulatory Visit
Admission: RE | Admit: 2023-06-26 | Discharge: 2023-06-26 | Disposition: A | Discharge status: Home / Self Care | Payer: Federal, State, Local not specified - PPO | Source: Ambulatory Visit | Attending: Family Medicine | Admitting: Family Medicine

## 2023-06-26 DIAGNOSIS — R059 Cough, unspecified: Secondary | ICD-10-CM | POA: Insufficient documentation
